# Patient Record
Sex: Female | Born: 1985 | Race: Black or African American | Hispanic: No | Marital: Single | Smoking: Former smoker
Health system: Southern US, Community
[De-identification: ages and names within clinical notes are randomized; demographics above are authoritative.]

## PROBLEM LIST (undated history)

## (undated) DIAGNOSIS — R069 Unspecified abnormalities of breathing: Secondary | ICD-10-CM

## (undated) HISTORY — PX: KNEE SURGERY: SHX244

## (undated) HISTORY — DX: Unspecified abnormalities of breathing: R06.9

---
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
## 2005-02-17 ENCOUNTER — Emergency Department: Payer: Self-pay | Admitting: Internal Medicine

## 2005-05-07 ENCOUNTER — Emergency Department: Payer: Self-pay | Admitting: Emergency Medicine

## 2005-09-12 ENCOUNTER — Inpatient Hospital Stay: Payer: Self-pay | Admitting: Obstetrics and Gynecology

## 2006-01-03 ENCOUNTER — Emergency Department: Payer: Self-pay | Admitting: Emergency Medicine

## 2006-01-20 ENCOUNTER — Emergency Department: Payer: Self-pay | Admitting: Emergency Medicine

## 2006-01-22 ENCOUNTER — Emergency Department: Payer: Self-pay | Admitting: Emergency Medicine

## 2006-04-18 ENCOUNTER — Emergency Department: Payer: Self-pay | Admitting: Emergency Medicine

## 2006-10-05 ENCOUNTER — Emergency Department: Payer: Self-pay | Admitting: Emergency Medicine

## 2006-11-05 ENCOUNTER — Emergency Department: Payer: Self-pay | Admitting: Emergency Medicine

## 2006-11-27 ENCOUNTER — Emergency Department: Payer: Self-pay | Admitting: Emergency Medicine

## 2006-12-22 ENCOUNTER — Emergency Department: Payer: Self-pay | Admitting: Emergency Medicine

## 2006-12-22 ENCOUNTER — Other Ambulatory Visit: Payer: Self-pay

## 2006-12-23 ENCOUNTER — Other Ambulatory Visit: Payer: Self-pay

## 2007-01-01 ENCOUNTER — Emergency Department: Payer: Self-pay | Admitting: Emergency Medicine

## 2007-01-22 ENCOUNTER — Emergency Department: Payer: Self-pay | Admitting: Emergency Medicine

## 2007-03-12 ENCOUNTER — Observation Stay: Payer: Self-pay | Admitting: Obstetrics and Gynecology

## 2007-06-28 ENCOUNTER — Observation Stay: Payer: Self-pay

## 2007-07-03 ENCOUNTER — Inpatient Hospital Stay: Payer: Self-pay

## 2007-07-29 ENCOUNTER — Emergency Department: Payer: Self-pay | Admitting: Emergency Medicine

## 2007-08-30 ENCOUNTER — Emergency Department: Payer: Self-pay | Admitting: Emergency Medicine

## 2007-10-08 ENCOUNTER — Emergency Department: Payer: Self-pay | Admitting: Emergency Medicine

## 2007-10-14 ENCOUNTER — Emergency Department: Payer: Self-pay | Admitting: Emergency Medicine

## 2007-11-07 ENCOUNTER — Emergency Department: Payer: Self-pay

## 2008-10-18 ENCOUNTER — Emergency Department: Payer: Self-pay | Admitting: Emergency Medicine

## 2008-12-14 ENCOUNTER — Emergency Department: Payer: Self-pay | Admitting: Emergency Medicine

## 2017-01-27 ENCOUNTER — Encounter: Payer: Self-pay | Admitting: Emergency Medicine

## 2017-01-27 ENCOUNTER — Emergency Department
Admission: EM | Admit: 2017-01-27 | Discharge: 2017-01-27 | Disposition: A | Discharge status: Home / Self Care | Payer: Medicaid Other | Attending: Emergency Medicine | Admitting: Emergency Medicine

## 2017-01-27 ENCOUNTER — Other Ambulatory Visit: Payer: Self-pay

## 2017-01-27 ENCOUNTER — Emergency Department: Payer: Medicaid Other

## 2017-01-27 DIAGNOSIS — Z3A00 Weeks of gestation of pregnancy not specified: Secondary | ICD-10-CM | POA: Diagnosis not present

## 2017-01-27 DIAGNOSIS — R102 Pelvic and perineal pain: Secondary | ICD-10-CM

## 2017-01-27 DIAGNOSIS — O26899 Other specified pregnancy related conditions, unspecified trimester: Secondary | ICD-10-CM

## 2017-01-27 DIAGNOSIS — R1030 Lower abdominal pain, unspecified: Secondary | ICD-10-CM | POA: Insufficient documentation

## 2017-01-27 DIAGNOSIS — R109 Unspecified abdominal pain: Secondary | ICD-10-CM

## 2017-01-27 DIAGNOSIS — O210 Mild hyperemesis gravidarum: Secondary | ICD-10-CM | POA: Diagnosis present

## 2017-01-27 DIAGNOSIS — O9989 Other specified diseases and conditions complicating pregnancy, childbirth and the puerperium: Secondary | ICD-10-CM | POA: Diagnosis not present

## 2017-01-27 DIAGNOSIS — R112 Nausea with vomiting, unspecified: Secondary | ICD-10-CM

## 2017-01-27 LAB — COMPREHENSIVE METABOLIC PANEL
ALT: 12 U/L — ABNORMAL LOW (ref 14–54)
AST: 14 U/L — ABNORMAL LOW (ref 15–41)
Albumin: 3.9 g/dL (ref 3.5–5.0)
Alkaline Phosphatase: 54 U/L (ref 38–126)
Anion gap: 5 (ref 5–15)
BUN: 12 mg/dL (ref 6–20)
CO2: 26 mmol/L (ref 22–32)
Calcium: 9 mg/dL (ref 8.9–10.3)
Chloride: 106 mmol/L (ref 101–111)
Creatinine, Ser: 0.75 mg/dL (ref 0.44–1.00)
GFR calc Af Amer: >60 mL/min (ref >60)
GFR calc non Af Amer: >60 mL/min (ref >60)
Glucose, Bld: 77 mg/dL (ref 65–99)
Potassium: 3.5 mmol/L (ref 3.5–5.1)
Sodium: 137 mmol/L (ref 135–145)
Total Bilirubin: 0.6 mg/dL (ref 0.3–1.2)
Total Protein: 7.6 g/dL (ref 6.5–8.1)

## 2017-01-27 LAB — CBC
HCT: 40.1 % (ref 35.0–47.0)
Hemoglobin: 13.3 g/dL (ref 12.0–16.0)
MCH: 32 pg (ref 26.0–34.0)
MCHC: 33.2 g/dL (ref 32.0–36.0)
MCV: 96.5 fL (ref 80.0–100.0)
Platelets: 297 10*3/uL (ref 150–440)
RBC: 4.16 MIL/uL (ref 3.80–5.20)
RDW: 12.5 % (ref 11.5–14.5)
WBC: 8.1 10*3/uL (ref 3.6–11.0)

## 2017-01-27 LAB — URINALYSIS, COMPLETE (UACMP) WITH MICROSCOPIC
Bilirubin Urine: NEGATIVE
Glucose, UA: NEGATIVE mg/dL
Hgb urine dipstick: NEGATIVE
Ketones, ur: NEGATIVE mg/dL
Leukocytes, UA: NEGATIVE
Nitrite: NEGATIVE
Protein, ur: NEGATIVE mg/dL
Specific Gravity, Urine: 1.023 (ref 1.005–1.030)
pH: 6 (ref 5.0–8.0)

## 2017-01-27 LAB — CHLAMYDIA/NGC RT PCR (ARMC ONLY)
Chlamydia Tr: NOT DETECTED
N gonorrhoeae: NOT DETECTED

## 2017-01-27 LAB — LIPASE, BLOOD: Lipase: 24 U/L (ref 11–51)

## 2017-01-27 LAB — POCT PREGNANCY, URINE: Preg Test, Ur: NEGATIVE

## 2017-01-27 LAB — HCG, QUANTITATIVE, PREGNANCY: hCG, Beta Chain, Quant, S: 160 m[IU]/mL — ABNORMAL HIGH (ref <5)

## 2017-01-27 MED ORDER — ONDANSETRON 4 MG PO TBDP
4.0000 mg | ORAL_TABLET | Freq: Once | ORAL | Status: AC
  Administered 2017-01-27: 4 mg via ORAL

## 2017-01-27 MED ORDER — ONDANSETRON 4 MG PO TBDP: 4.0000 mg | ORAL_TABLET | Freq: Three times a day (TID) | ORAL | 0 refills | Status: DC | PRN

## 2017-01-27 MED ORDER — ONDANSETRON 4 MG PO TBDP: 4.0000 mg | ORAL_TABLET | Freq: Once | ORAL | Status: DC

## 2017-01-27 MED ORDER — ONDANSETRON 4 MG PO TBDP
ORAL_TABLET | ORAL | Status: AC
  Filled 2017-01-27: qty 1

## 2017-01-27 NOTE — ED Provider Notes (Addendum)
[facility] Emergency Department Provider Note       Time seen: ----------------------------------------- 2:47 PM on 01/27/2017 -----------------------------------------   I have reviewed the triage vital signs and the nursing notes.  HISTORY   Chief Complaint Emesis    HPI Heidi Douglas is a 31 y.o. female with a history of knee surgery but otherwise healthy who presents to the ED for vomiting and intermittent lower abdominal cramping.  She denies any fevers or diarrhea.  She states her last menstrual period was over a month ago and her menstrual cycle is late at this time.  She denies any recent illness or other complaints.  History reviewed. No pertinent past medical history.  There are no active problems to display for this patient.   Past Surgical History:  Procedure Laterality Date  . KNEE SURGERY      Allergies Patient has no known allergies.  Social History Social History   Tobacco Use  . Smoking status: Never Smoker  . Smokeless tobacco: Never Used  Substance Use Topics  . Alcohol use: No    Frequency: Never  . Drug use: No    Review of Systems Constitutional: Negative for fever. Cardiovascular: Negative for chest pain. Respiratory: Negative for shortness of breath. Gastrointestinal: Positive for abdominal cramping and vomiting Genitourinary: Negative for dysuria. Musculoskeletal: Negative for back pain. Skin: Negative for rash. Neurological: Negative for headaches, focal weakness or numbness.  All systems negative/normal/unremarkable except as stated in the HPI  ____________________________________________   PHYSICAL EXAM:  VITAL SIGNS: ED Triage Vitals  Enc Vitals Group     BP 01/27/17 1152 138/78     Pulse Rate 01/27/17 1152 86     Resp 01/27/17 1152 18     Temp 01/27/17 1152 98.6 F (37 C)     Temp Source 01/27/17 1152 Oral     SpO2 01/27/17 1152 100 %     Weight 01/27/17 1153 175 lb (79.4 kg)   Height 01/27/17 1153 5\' 3"  (1.6 m)     Head Circumference --      Peak Flow --      Pain Score 01/27/17 1424 7     Pain Loc --      Pain Edu? --      Excl. in GC? --     Constitutional: Alert and oriented. Well appearing and in no distress. Eyes: Conjunctivae are normal. Normal extraocular movements. ENT   Head: Normocephalic and atraumatic.   Nose: No congestion/rhinnorhea.   Mouth/Throat: Mucous membranes are moist.   Neck: No stridor. Cardiovascular: Normal rate, regular rhythm. No murmurs, rubs, or gallops. Respiratory: Normal respiratory effort without tachypnea nor retractions. Breath sounds are clear and equal bilaterally. No wheezes/rales/rhonchi. Gastrointestinal: Soft and nontender. Normal bowel sounds Musculoskeletal: Nontender with normal range of motion in extremities. No lower extremity tenderness nor edema. Neurologic:  Normal speech and language. No gross focal neurologic deficits are appreciated.  Skin:  Skin is warm, dry and intact. No rash noted. Psychiatric: Mood and affect are normal. Speech and behavior are normal.  ____________________________________________  ED COURSE:  As part of my medical decision making, I reviewed the following data within the electronic MEDICAL RECORD NUMBER History obtained from family if available, nursing notes, old chart and ekg, as well as notes from prior ED visits. Patient presented for abdominal pain and vomiting, we will assess with labs and imaging as indicated at this time. Clinical Course as of Jan 28 1704  Fri Jan 27, 2017  1537  HCG, Beta Chain, Quant, S: (!) 160 [JW]  1541 G1P0  [JW]    Clinical Course User Index [JW] Emily FilbertWilliams, Clela Hagadorn E, MD   Procedures ____________________________________________   LABS (pertinent positives/negatives)  Labs Reviewed  COMPREHENSIVE METABOLIC PANEL - Abnormal; Notable for the following components:      Result Value   AST 14 (*)    ALT 12 (*)    All other components  within normal limits  URINALYSIS, COMPLETE (UACMP) WITH MICROSCOPIC - Abnormal; Notable for the following components:   Color, Urine YELLOW (*)    APPearance CLEAR (*)    Bacteria, UA MANY (*)    Squamous Epithelial / LPF 0-5 (*)    All other components within normal limits  HCG, QUANTITATIVE, PREGNANCY - Abnormal; Notable for the following components:   hCG, Beta Chain, Quant, S 160 (*)    All other components within normal limits  CHLAMYDIA/NGC RT PCR ([facility] ONLY)  LIPASE, BLOOD  CBC  POC URINE PREG, ED  POCT PREGNANCY, URINE  Imaging: IMPRESSION: 1. No intrauterine pregnancy is visualized. Hypoechoic area with central low echogenic focus measuring 1.3 cm, either exophytic to the left ovary or immediately adjacent to it, possibility of ectopic pregnancy is raised. No free fluid at this time. Close clinical monitoring with trending of HCG and repeat short interval ultrasound is recommended. 2. Negative for ovarian torsion  Critical Value/emergent results were called by telephone at the time of interpretation on 01/27/2017 at 5:01 pm to Dr. Daryel NovemberJONATHAN Marin Milley , who verbally acknowledged these results. ____________________________________________  DIFFERENTIAL DIAGNOSIS   Pregnancy, ectopic pregnancy, dysmenorrhea, ovarian cyst, PID, gas pain, constipation  FINAL ASSESSMENT AND PLAN  Pregnancy, abdominal pain, vomiting   Plan: Patient had presented for abdominal pain and vomiting.  Initially it was thought that she was not pregnant, however the hCG in her serum was elevated.  Ultrasound was obtained which is indeterminate at this time.  I will advised follow-up in 2 days for repeat quant and reevaluation.  She will be given antiemetics and overall looks well here with no pain at this time.  Emily FilbertWilliams, Leshae Mcclay E, MD   Note: This note was generated in part or whole with voice recognition software. Voice recognition is usually quite accurate but there are  transcription errors that can and very often do occur. I apologize for any typographical errors that were not detected and corrected.     Emily FilbertWilliams, Xavious Sharrar E, MD 01/27/17 1449    Emily FilbertWilliams, Elicia Lui E, MD 01/27/17 [phone]

## 2017-01-27 NOTE — ED Notes (Signed)
Dr Williams at bedside 

## 2017-01-27 NOTE — ED Notes (Signed)
Patient transported to US 

## 2017-01-27 NOTE — ED Triage Notes (Signed)
Pt here for intermittent vomiting over last week with some intermittent lower abdominal cramping.  Denies fevers. Denies diarrhea. Ambulatory. VSS. Last period nov 23 but reports sometimes not regular.

## 2017-01-28 DIAGNOSIS — Z3A01 Less than 8 weeks gestation of pregnancy: Secondary | ICD-10-CM | POA: Diagnosis not present

## 2017-01-28 DIAGNOSIS — O200 Threatened abortion: Secondary | ICD-10-CM | POA: Insufficient documentation

## 2017-01-28 DIAGNOSIS — R1030 Lower abdominal pain, unspecified: Secondary | ICD-10-CM | POA: Insufficient documentation

## 2017-01-28 DIAGNOSIS — O26891 Other specified pregnancy related conditions, first trimester: Secondary | ICD-10-CM | POA: Insufficient documentation

## 2017-01-28 DIAGNOSIS — O209 Hemorrhage in early pregnancy, unspecified: Secondary | ICD-10-CM | POA: Diagnosis present

## 2017-01-29 ENCOUNTER — Emergency Department
Admission: EM | Admit: 2017-01-29 | Discharge: 2017-01-29 | Disposition: A | Discharge status: Home / Self Care | Payer: Medicaid Other | Attending: Emergency Medicine | Admitting: Emergency Medicine

## 2017-01-29 ENCOUNTER — Other Ambulatory Visit: Payer: Self-pay

## 2017-01-29 ENCOUNTER — Encounter: Payer: Self-pay | Admitting: Emergency Medicine

## 2017-01-29 DIAGNOSIS — O200 Threatened abortion: Secondary | ICD-10-CM

## 2017-01-29 LAB — BASIC METABOLIC PANEL
Anion gap: 8 (ref 5–15)
BUN: 14 mg/dL (ref 6–20)
CO2: 24 mmol/L (ref 22–32)
Calcium: 9.3 mg/dL (ref 8.9–10.3)
Chloride: 105 mmol/L (ref 101–111)
Creatinine, Ser: 0.69 mg/dL (ref 0.44–1.00)
GFR calc Af Amer: >60 mL/min (ref >60)
GFR calc non Af Amer: >60 mL/min (ref >60)
Glucose, Bld: 97 mg/dL (ref 65–99)
Potassium: 3.7 mmol/L (ref 3.5–5.1)
Sodium: 137 mmol/L (ref 135–145)

## 2017-01-29 LAB — URINALYSIS, COMPLETE (UACMP) WITH MICROSCOPIC
Bacteria, UA: NONE SEEN
Bilirubin Urine: NEGATIVE
Glucose, UA: NEGATIVE mg/dL
Hgb urine dipstick: NEGATIVE
Ketones, ur: NEGATIVE mg/dL
Leukocytes, UA: NEGATIVE
Nitrite: NEGATIVE
Protein, ur: NEGATIVE mg/dL
RBC / HPF: NONE SEEN RBC/hpf (ref 0–5)
Specific Gravity, Urine: 1.03 (ref 1.005–1.030)
pH: 5 (ref 5.0–8.0)

## 2017-01-29 LAB — HCG, QUANTITATIVE, PREGNANCY: hCG, Beta Chain, Quant, S: 128 m[IU]/mL — ABNORMAL HIGH (ref <5)

## 2017-01-29 LAB — POCT PREGNANCY, URINE: Preg Test, Ur: NEGATIVE

## 2017-01-29 NOTE — ED Provider Notes (Signed)
[facility] Emergency Department Provider Note  ____________________________________________   First MD Initiated Contact with Patient 01/29/17 0236     (approximate)  I have reviewed the triage vital signs and the nursing notes.   HISTORY  Chief Complaint Vaginal Bleeding   HPI Heidi Douglas is a 31 y.o. female who comes the emergency department with lower pelvic cramping and slight vaginal bleeding.  She was seen in our emergency department yesterday and had a pelvic ultrasound which is negative for obvious intrauterine pregnancy although was concerning for a possible mass in her tube.  She was told to follow[facility]up with [facility]ne[facility] in 2 days.  She comes the emergency department today because of the bleeding.  She is G3 P2 with 2 healthy children.  She denies fevers or chills.  She denies nausea or vomiting.  Her symptoms have been insidious onset slowly progressive intermittent.  Nothing seems to make them better or worse.  History reviewed. No pertinent past medical history.  There are no active problems to display for this patient.   Past Surgical History:  Procedure Laterality Date  . KNEE SURGERY      Prior to Admission medications   Medication Sig Start Date End Date Taking? Authorizing Provider  ondansetron (ZOFRAN ODT) 4 MG disintegrating tablet Take 1 tablet (4 mg total) by mouth every 8 (eight) hours as needed for nausea or vomiting. 01/27/17   Emily FilbertWilliams, Jonathan E, MD    Allergies Patient has no known allergies.  No family history on file.  Social History Social History   Tobacco Use  . Smoking status: Never Smoker  . Smokeless tobacco: Never Used  Substance Use Topics  . Alcohol use: No    Frequency: Never  . Drug use: No    Review of Systems Constitutional: No fever/chills Eyes: No visual changes. ENT: No sore throat. Cardiovascular: Denies chest pain. Respiratory: Denies shortness of breath. Gastrointestinal:  Positive for abdominal pain.  No nausea, no vomiting.  No diarrhea.  No constipation. Genitourinary: Negative for dysuria. Musculoskeletal: Negative for back pain. Skin: Negative for rash. Neurological: Negative for headaches, focal weakness or numbness.   ____________________________________________   PHYSICAL EXAM:  VITAL SIGNS: ED Triage Vitals  Enc Vitals Group     BP 01/29/17 0027 115/68     Pulse Rate 01/29/17 0027 78     Resp 01/29/17 0027 18     Temp 01/29/17 0027 98.1 F (36.7 C)     Temp Source 01/29/17 0027 Oral     SpO2 --      Weight 01/29/17 0024 175 lb (79.4 kg)     Height 01/29/17 0024 5\' 3"  (1.6 m)     Head Circumference --      Peak Flow --      Pain Score 01/29/17 0023 8     Pain Loc --      Pain Edu? --      Excl. in GC? --     Constitutional: Alert and oriented x4 well-appearing nontoxic no diaphoresis speaks in full clear sentences Eyes: PERRL EOMI. Head: Atraumatic. Nose: No congestion/rhinnorhea. Mouth/Throat: No trismus Neck: No stridor.   Cardiovascular: Normal rate, regular rhythm. Grossly normal heart sounds.  Good peripheral circulation. Respiratory: Normal respiratory effort.  No retractions. Lungs CTAB and moving good air Gastrointestinal: Soft nontender Musculoskeletal: No lower extremity edema   Neurologic:  Normal speech and language. No gross focal neurologic deficits are appreciated. Skin:  Skin is warm, dry and intact. No  rash noted. Psychiatric: Mood and affect are normal. Speech and behavior are normal.    ____________________________________________   DIFFERENTIAL includes but not limited to  Ectopic pregnancy, threatened abortion, inevitable abortion, septic abortion ____________________________________________   LABS (all labs ordered are listed, but only abnormal results are displayed)  Labs Reviewed  HCG, QUANTITATIVE, PREGNANCY - Abnormal; Notable for the following components:      Result Value   hCG, Beta  Chain, Quant, S 128 (*)    All other components within normal limits  URINALYSIS, COMPLETE (UACMP) WITH MICROSCOPIC - Abnormal; Notable for the following components:   Color, Urine YELLOW (*)    APPearance CLEAR (*)    Squamous Epithelial / LPF 0-5 (*)    All other components within normal limits  BASIC METABOLIC PANEL  CBC WITH DIFFERENTIAL/PLATELET  CBC WITH DIFFERENTIAL/PLATELET  POCT PREGNANCY, URINE  POC URINE PREG, ED    Lab work reviewed by me shows decreasing beta-hCG __________________________________________  EKG   ____________________________________________  RADIOLOGY  Pelvic ultrasound from yesterday shows no intrauterine pregnancy but does show somewhat of a tubal anomaly ____________________________________________   PROCEDURES  Procedure(s) performed: no  Procedures  Critical Care performed: no  Observation: no ____________________________________________   INITIAL IMPRESSION / ASSESSMENT AND PLAN / ED COURSE  Pertinent labs & imaging results that were available during my care of the patient were reviewed by me and considered in my medical decision making (see chart for details).  The patient is hemodynamically stable and very well-appearing with benign abdominal exam.  Her 24-hour beta hCG is actually down from yesterday indicating likely inevitable miscarriage.  She does not yet have [facility] as well refer her for OB periods pelvic rest discussed.  The patient was discharged home in improved condition verbalizes understanding and agreement with the plan.  The patient understands that this could represent an ectopic pregnancy and to return immediately for severe pain or for any other issues.      ____________________________________________   FINAL CLINICAL IMPRESSION(S) / ED DIAGNOSES  Final diagnoses:  Threatened abortion      NEW MEDICATIONS STARTED DURING THIS VISIT:  This SmartLink is deprecated. Use AVSMEDLIST  instead to display the medication list for a patient.   Note:  This document was prepared using Dragon voice recognition software and may include unintentional dictation errors.     Merrily Brittleifenbark, Alazae Crymes, MD 01/29/17 [id number]

## 2017-01-29 NOTE — ED Notes (Signed)
Dr. Rifenbark at bedside 

## 2017-01-29 NOTE — ED Triage Notes (Signed)
Pt arrive ambulatory to triage with c/o vaginal bleeding when she wipes. Pt reports that she is around five weeks pregnant which she found out at this facility. Pt is in NAD.

## 2017-01-29 NOTE — Discharge Instructions (Signed)
Please make an appointment to [facility] this coming Monday for reevaluation and return to the emergency department sooner for any concerns whatsoever.  It was a pleasure to take care of you today, and thank you for coming to our emergency department.  If you have any questions or concerns before leaving please ask the nurse to grab me and I'm more than happy to go through your aftercare instructions again.  If you were prescribed any opioid pain medication today such as Norco, Vicodin, Percocet, morphine, hydrocodone, or oxycodone please make sure you do not drive when you are taking this medication as it can alter your ability to drive safely.  If you have any concerns once you are home that you are not improving or are in fact getting worse before you can make it to your follow-up appointment, please do not hesitate to call 911 and come back for further evaluation.  Merrily BrittleNeil Garin Mata, MD  Results for orders placed or performed during the hospital encounter of 01/29/17  hCG, quantitative, pregnancy  Result Value Ref Range   hCG, Beta Chain, Quant, S 128 (H) <5 mIU/mL  Basic metabolic panel  Result Value Ref Range   Sodium 137 135 - 145 mmol/L   Potassium 3.7 3.5 - 5.1 mmol/L   Chloride 105 101 - 111 mmol/L   CO2 24 22 - 32 mmol/L   Glucose, Bld 97 65 - 99 mg/dL   BUN 14 6 - 20 mg/dL   Creatinine, Ser 1.610.69 0.44 - 1.00 mg/dL   Calcium 9.3 8.9 - 09.610.3 mg/dL   GFR calc non Af Amer >60 >60 mL/min   GFR calc Af Amer >60 >60 mL/min   Anion gap 8 5 - 15  Urinalysis, Complete w Microscopic  Result Value Ref Range   Color, Urine YELLOW (A) YELLOW   APPearance CLEAR (A) CLEAR   Specific Gravity, Urine 1.030 1.005 - 1.030   pH 5.0 5.0 - 8.0   Glucose, UA NEGATIVE NEGATIVE mg/dL   Hgb urine dipstick NEGATIVE NEGATIVE   Bilirubin Urine NEGATIVE NEGATIVE   Ketones, ur NEGATIVE NEGATIVE mg/dL   Protein, ur NEGATIVE NEGATIVE mg/dL   Nitrite NEGATIVE NEGATIVE   Leukocytes, UA  NEGATIVE NEGATIVE   RBC / HPF NONE SEEN 0 - 5 RBC/hpf   WBC, UA 0-5 0 - 5 WBC/hpf   Bacteria, UA NONE SEEN NONE SEEN   Squamous Epithelial / LPF 0-5 (A) NONE SEEN   Mucus PRESENT   Pregnancy, urine POC  Result Value Ref Range   Preg Test, Ur NEGATIVE NEGATIVE   [place] Ob Comp Less 14 Wks  Result Date: 01/27/2017 CLINICAL DATA:  Abdominal pain for 3 weeks, HCG is 160 EXAM: OBSTETRIC <14 WK US AND TRANSVAGINAL OB US DOPPLER ULTRASOUND OF OVARIES TECHNIQUE: Both transabdominal and transvaginal ultrasound examinations were performed for complete evaluation of the gestation as well as the maternal uterus, adnexal regions, and pelvic cul-de-sac. Transvaginal technique was performed to assess early pregnancy. Color and duplex Doppler ultrasound was utilized to evaluate blood flow to the ovaries. COMPARISON:  None. FINDINGS: Intrauterine gestational sac: Not seen Yolk sac:  Not seen Embryo:  Not seen Cardiac Activity: Not seen Maternal uterus/adnexae: The right ovary measures 2.8 x 1.5 x 2.9 cm. The left ovary measures 3 x 2.1 x 2.8 cm. Corpus luteal body in the left ovary measuring 1.3 cm. Exophytic to or immediately adjacent to the left ovary is a mass with peripheral hypo echo tissue and central low echogenic  focus measuring 1.3 cm. No significant free fluid. Pulsed Doppler evaluation of both ovaries demonstrates normal appearing low-resistance arterial and venous waveforms. IMPRESSION: 1. No intrauterine pregnancy is visualized. Hypoechoic area with central low echogenic focus measuring 1.3 cm, either exophytic to the left ovary or immediately adjacent to it, possibility of ectopic pregnancy is raised. No free fluid at this time. Close clinical monitoring with trending of HCG and repeat short interval ultrasound is recommended. 2. Negative for ovarian torsion Critical Value/emergent results were called by telephone at the time of interpretation on 01/27/2017 at 5:01 pm to Dr. Daryel November , who verbally  acknowledged these results. Electronically Signed   By: Jasmine Pang M.D.   On: 01/27/2017 17:01   US Ob Transvaginal  Result Date: 01/27/2017 CLINICAL DATA:  Abdominal pain for 3 weeks, HCG is 160 EXAM: OBSTETRIC <14 WK [place] AND TRANSVAGINAL OB US DOPPLER ULTRASOUND OF OVARIES TECHNIQUE: Both transabdominal and transvaginal ultrasound examinations were performed for complete evaluation of the gestation as well as the maternal uterus, adnexal regions, and pelvic cul-de-sac. Transvaginal technique was performed to assess early pregnancy. Color and duplex Doppler ultrasound was utilized to evaluate blood flow to the ovaries. COMPARISON:  None. FINDINGS: Intrauterine gestational sac: Not seen Yolk sac:  Not seen Embryo:  Not seen Cardiac Activity: Not seen Maternal uterus/adnexae: The right ovary measures 2.8 x 1.5 x 2.9 cm. The left ovary measures 3 x 2.1 x 2.8 cm. Corpus luteal body in the left ovary measuring 1.3 cm. Exophytic to or immediately adjacent to the left ovary is a mass with peripheral hypo echo tissue and central low echogenic focus measuring 1.3 cm. No significant free fluid. Pulsed Doppler evaluation of both ovaries demonstrates normal appearing low-resistance arterial and venous waveforms. IMPRESSION: 1. No intrauterine pregnancy is visualized. Hypoechoic area with central low echogenic focus measuring 1.3 cm, either exophytic to the left ovary or immediately adjacent to it, possibility of ectopic pregnancy is raised. No free fluid at this time. Close clinical monitoring with trending of HCG and repeat short interval ultrasound is recommended. 2. Negative for ovarian torsion Critical Value/emergent results were called by telephone at the time of interpretation on 01/27/2017 at 5:01 pm to Dr. Daryel November , who verbally acknowledged these results. Electronically Signed   By: Jasmine Pang M.D.   On: 01/27/2017 17:01   US Pelvic Doppler (torsion R/o Or Mass Arterial Flow)  Result Date:  01/27/2017 CLINICAL DATA:  Abdominal pain for 3 weeks, HCG is 160 EXAM: OBSTETRIC <14 WK [place] AND TRANSVAGINAL OB US DOPPLER ULTRASOUND OF OVARIES TECHNIQUE: Both transabdominal and transvaginal ultrasound examinations were performed for complete evaluation of the gestation as well as the maternal uterus, adnexal regions, and pelvic cul-de-sac. Transvaginal technique was performed to assess early pregnancy. Color and duplex Doppler ultrasound was utilized to evaluate blood flow to the ovaries. COMPARISON:  None. FINDINGS: Intrauterine gestational sac: Not seen Yolk sac:  Not seen Embryo:  Not seen Cardiac Activity: Not seen Maternal uterus/adnexae: The right ovary measures 2.8 x 1.5 x 2.9 cm. The left ovary measures 3 x 2.1 x 2.8 cm. Corpus luteal body in the left ovary measuring 1.3 cm. Exophytic to or immediately adjacent to the left ovary is a mass with peripheral hypo echo tissue and central low echogenic focus measuring 1.3 cm. No significant free fluid. Pulsed Doppler evaluation of both ovaries demonstrates normal appearing low-resistance arterial and venous waveforms. IMPRESSION: 1. No intrauterine pregnancy is visualized. Hypoechoic area with central low echogenic focus  measuring 1.3 cm, either exophytic to the left ovary or immediately adjacent to it, possibility of ectopic pregnancy is raised. No free fluid at this time. Close clinical monitoring with trending of HCG and repeat short interval ultrasound is recommended. 2. Negative for ovarian torsion Critical Value/emergent results were called by telephone at the time of interpretation on 01/27/2017 at 5:01 pm to Dr. Daryel NovemberJONATHAN WILLIAMS , who verbally acknowledged these results. Electronically Signed   By: Jasmine PangKim  Fujinaga M.D.   On: 01/27/2017 17:01

## 2017-01-29 NOTE — ED Notes (Signed)
Pt says she was here 2 days ago with N/V and found out she is [redacted] weeks pregnant; returns tonight with some abd cramping and vaginal spotting; abd soft; BS x 4

## 2017-01-30 ENCOUNTER — Emergency Department
Admission: EM | Admit: 2017-01-30 | Discharge: 2017-01-30 | Disposition: A | Discharge status: Home / Self Care | Payer: Medicaid Other | Attending: Emergency Medicine | Admitting: Emergency Medicine

## 2017-01-30 ENCOUNTER — Encounter: Payer: Self-pay | Admitting: Emergency Medicine

## 2017-01-30 ENCOUNTER — Other Ambulatory Visit: Payer: Self-pay

## 2017-01-30 ENCOUNTER — Encounter (HOSPITAL_COMMUNITY): Payer: Self-pay | Admitting: Emergency Medicine

## 2017-01-30 ENCOUNTER — Emergency Department (HOSPITAL_COMMUNITY)
Admission: EM | Admit: 2017-01-30 | Discharge: 2017-01-31 | Disposition: A | Discharge status: Home / Self Care | Payer: Medicaid Other | Attending: Emergency Medicine | Admitting: Emergency Medicine

## 2017-01-30 DIAGNOSIS — N938 Other specified abnormal uterine and vaginal bleeding: Secondary | ICD-10-CM | POA: Diagnosis present

## 2017-01-30 DIAGNOSIS — Z5321 Procedure and treatment not carried out due to patient leaving prior to being seen by health care provider: Secondary | ICD-10-CM | POA: Insufficient documentation

## 2017-01-30 DIAGNOSIS — Z3A01 Less than 8 weeks gestation of pregnancy: Secondary | ICD-10-CM | POA: Diagnosis not present

## 2017-01-30 DIAGNOSIS — R102 Pelvic and perineal pain: Secondary | ICD-10-CM | POA: Diagnosis not present

## 2017-01-30 DIAGNOSIS — O209 Hemorrhage in early pregnancy, unspecified: Secondary | ICD-10-CM | POA: Insufficient documentation

## 2017-01-30 LAB — HCG, QUANTITATIVE, PREGNANCY: hCG, Beta Chain, Quant, S: 131 m[IU]/mL — ABNORMAL HIGH (ref <5)

## 2017-01-30 LAB — ABO/RH: ABO/RH(D): A POS

## 2017-01-30 NOTE — ED Triage Notes (Signed)
Pt [facility] at [facility] today, has been seen multiple times for the same, states she is [redacted] weeks pregnant and has been having vaginal bleeding and cramping since Saturday, denies any clots. [facility]. Pt states she [facility] like [facility] so she came here to be seen.

## 2017-01-30 NOTE — ED Notes (Signed)
Pt did not answer for room. 

## 2017-01-30 NOTE — ED Triage Notes (Addendum)
Pt here for vaginal bleeding and [redacted] weeks pregnant. Seen X 2 here for same. Bleeding and cramping worse per pt and OBGYN closed today.  G4P3

## 2017-01-30 NOTE — ED Notes (Signed)
Patient named called in Lobby at this time by this RN with no answer X3

## 2017-01-31 NOTE — ED Notes (Signed)
Pt called multiple times for treatment room and vitals recheck by this RN. Unable to locate in lobby, presumed to have left without being seen. 

## 2017-02-02 ENCOUNTER — Ambulatory Visit (INDEPENDENT_AMBULATORY_CARE_PROVIDER_SITE_OTHER): Payer: Medicaid Other | Admitting: Certified Nurse Midwife

## 2017-02-02 ENCOUNTER — Encounter: Payer: Self-pay | Admitting: Certified Nurse Midwife

## 2017-02-02 VITALS — BP 131/70 | HR 74 | Wt 180.3 lb

## 2017-02-02 DIAGNOSIS — Z09 Encounter for follow-up examination after completed treatment for conditions other than malignant neoplasm: Secondary | ICD-10-CM | POA: Diagnosis not present

## 2017-02-02 DIAGNOSIS — O200 Threatened abortion: Secondary | ICD-10-CM

## 2017-02-02 NOTE — Patient Instructions (Signed)
Vaginal Bleeding During Pregnancy, First Trimester °A small amount of bleeding (spotting) from the vagina is common in early pregnancy. Sometimes the bleeding is normal and is not a problem, and sometimes it is a sign of something serious. Be sure to tell your doctor about any bleeding from your vagina right away. °Follow these instructions at home: °· Watch your condition for any changes. °· Follow your doctor's instructions about how active you can be. °· If you are on bed rest: °? You may need to stay in bed and only get up to use the bathroom. °? You may be allowed to do some activities. °? If you need help, make plans for someone to help you. °· Write down: °? The number of pads you use each day. °? How often you change pads. °? How soaked (saturated) your pads are. °· Do not use tampons. °· Do not douche. °· Do not have sex or orgasms until your doctor says it is okay. °· If you pass any tissue from your vagina, save the tissue so you can show it to your doctor. °· Only take medicines as told by your doctor. °· Do not take aspirin because it can make you bleed. °· Keep all follow-up visits as told by your doctor. °Contact a doctor if: °· You bleed from your vagina. °· You have cramps. °· You have labor pains. °· You have a fever that does not go away after you take medicine. °Get help right away if: °· You have very bad cramps in your back or belly (abdomen). °· You pass large clots or tissue from your vagina. °· You bleed more. °· You feel light-headed or weak. °· You pass out (faint). °· You have chills. °· You are leaking fluid or have a gush of fluid from your vagina. °· You pass out while pooping (having a bowel movement). °This information is not intended to replace advice given to you by your health care provider. Make sure you discuss any questions you have with your health care provider. °Document Released: 06/03/2013 Document Revised: 06/25/2015 Document Reviewed: 09/24/2012 °Elsevier Interactive  Patient Education © 2018 [employer] ° °

## 2017-02-02 NOTE — Progress Notes (Signed)
ED follow up- Patient was seen in the ED due to abdominal cramping and vaginal bleeding. She reports that the bleeding has stopped, but she still has cramping. She took a home pregnancy test 2 days ago and it was still positive.

## 2017-02-03 ENCOUNTER — Other Ambulatory Visit: Payer: Self-pay | Admitting: Certified Nurse Midwife

## 2017-02-03 ENCOUNTER — Encounter: Payer: Self-pay | Admitting: Certified Nurse Midwife

## 2017-02-03 DIAGNOSIS — N926 Irregular menstruation, unspecified: Secondary | ICD-10-CM

## 2017-02-03 LAB — BETA HCG QUANT (REF LAB): hCG Quant: 99 m[IU]/mL

## 2017-02-03 NOTE — Progress Notes (Signed)
GYN ENCOUNTER NOTE  Subjective:       Heidi Douglas is a 32 y.o. G3P2 female here for ER follow up secondary to threaten abortion.   Originally seen in [facility] ER for vaginal bleeding between 12/28-12/03/2016. Reports positive home pregnancy test yesterday and cessation of vaginal bleeding on Tuesday, 01/31/2017. She endorses intermittent abdominal cramping.  Denies difficulty breathing or respiratory distress, chest pain, abdominal pain, vaginal bleeding dysuria, and leg pain or swelling.    Gynecologic History  Patient's last menstrual period was 12/23/2016.  Estimated date of birth: 09/29/2017.  Gestational age: 94 weeks 6 days  Obstetric History  OB History  Gravida Para Term Preterm AB Living  3 2          SAB TAB Ectopic Multiple Live Births               # Outcome Date GA Lbr Len/2nd Weight Sex Delivery Anes PTL Lv  3 Current           2 Para           1 Para               No past medical history on file.  Past Surgical History:  Procedure Laterality Date  . KNEE SURGERY      Current Outpatient Medications on File Prior to Visit  Medication Sig Dispense Refill  . ondansetron (ZOFRAN ODT) 4 MG disintegrating tablet Take 1 tablet (4 mg total) by mouth every 8 (eight) hours as needed for nausea or vomiting. (Patient not taking: Reported on 02/02/2017) 20 tablet 0   No current facility-administered medications on file prior to visit.     No Known Allergies  Social History   Socioeconomic History  . Marital status: In a relationship    Spouse name: Not on file  . Number of children: Not on file  . Years of education: Not on file  . Highest education level: Not on file  Social Needs  . Financial resource strain: Not on file  . Food insecurity - worry: Not on file  . Food insecurity - inability: Not on file  . Transportation needs - medical: Not on file  . Transportation needs - non-medical: Not on file  Occupational History  . Not on file  Tobacco Use   . Smoking status: Never Smoker  . Smokeless tobacco: Never Used  Substance and Sexual Activity  . Alcohol use: No    Frequency: Never  . Drug use: No  . Sexual activity: Yes  Other Topics Concern  . Not on file  Social History Narrative  . Not on file   The following portions of the patient's history were reviewed and updated as appropriate: allergies, current medications, past family history, past medical history, past social history, past surgical history and problem list.  Review of Systems  Review of Systems - Negative except as noted above. History obtained from the patient.  Objective:   BP 131/70   Pulse 74   Wt 180 lb 4.8 oz (81.8 kg)   LMP 12/23/2016   BMI 31.94 kg/m    CONSTITUTIONAL: Well-developed, well-nourished female in no acute distress.   HENT:  Normocephalic, atraumatic.   NECK: Normal range of motion, supple, no masses.    SKIN: Skin is warm and dry. No rash noted. Not diaphoretic. No erythema. No pallor.  NEUROLGIC: Alert and oriented to person, place, and time.   PSYCHIATRIC: Normal mood and affect. Normal behavior. Normal judgment and thought  content.  MUSCULOSKELETAL: Normal range of motion. No tenderness.  No cyanosis, clubbing, or edema.   Ref Range & Units 4d ago  hCG, Beta Chain, Quant, S <5 mIU/mL 131 Abnormally high    Comment:      GEST. AGE   CONC. (mIU/mL)   <=1 WEEK    5 - 50    2 WEEKS    50 - 500    3 WEEKS    100 - 10,000    4 WEEKS   1,000 - 30,000    5 WEEKS   3,500 - 115,000   6-8 WEEKS   12,000 - 270,000   12 WEEKS   15,000 - 220,000      FEMALE AND NON-PREGNANT FEMALE:    LESS THAN 5 mIU/mL  Performed at [facility], [address] [place], [place] [id number]          CLINICAL DATA:  Abdominal pain for 3 weeks, HCG is 160  EXAM: OBSTETRIC <14 WK US AND TRANSVAGINAL OB  US DOPPLER ULTRASOUND OF OVARIES [facility] 01/27/2017)  TECHNIQUE: Both transabdominal  and transvaginal ultrasound examinations were performed for complete evaluation of the gestation as well as the maternal uterus, adnexal regions, and pelvic cul-de-sac. Transvaginal technique was performed to assess early pregnancy.  Color and duplex Doppler ultrasound was utilized to evaluate blood flow to the ovaries.  COMPARISON:  None.  FINDINGS: Intrauterine gestational sac: Not seen  Yolk sac:  Not seen  Embryo:  Not seen  Cardiac Activity: Not seen  Maternal uterus/adnexae: The right ovary measures 2.8 x 1.5 x 2.9 cm. The left ovary measures 3 x 2.1 x 2.8 cm. Corpus luteal body in the left ovary measuring 1.3 cm. Exophytic to or immediately adjacent to the left ovary is a mass with peripheral hypo echo tissue and central low echogenic focus measuring 1.3 cm. No significant free fluid.  Pulsed Doppler evaluation of both ovaries demonstrates normal appearing low-resistance arterial and venous waveforms.  IMPRESSION: 1. No intrauterine pregnancy is visualized. Hypoechoic area with central low echogenic focus measuring 1.3 cm, either exophytic to the left ovary or immediately adjacent to it, possibility of ectopic pregnancy is raised. No free fluid at this time. Close clinical monitoring with trending of HCG and repeat short interval ultrasound is recommended. 2. Negative for ovarian torsion  Assessment:   1. Threatened abortion  - Beta HCG, Quant  2. Follow-up exam   Plan:   Labs: Beta, see orders.   Reviewed red flag symptoms and when to call.   RTC x 1 week for follow up and dating/viability US or sooner if needed.    Gunnar BullaJenkins Michelle Prentis Langdon, CNM [facility], [facility]

## 2017-02-06 ENCOUNTER — Other Ambulatory Visit: Payer: Self-pay

## 2017-02-06 DIAGNOSIS — O200 Threatened abortion: Secondary | ICD-10-CM

## 2017-02-07 ENCOUNTER — Other Ambulatory Visit: Payer: Medicaid Other

## 2017-02-07 DIAGNOSIS — Z09 Encounter for follow-up examination after completed treatment for conditions other than malignant neoplasm: Secondary | ICD-10-CM

## 2017-02-07 DIAGNOSIS — O200 Threatened abortion: Secondary | ICD-10-CM

## 2017-02-08 LAB — BETA HCG QUANT (REF LAB): hCG Quant: 59 m[IU]/mL

## 2017-02-09 ENCOUNTER — Encounter: Payer: Self-pay | Admitting: Certified Nurse Midwife

## 2017-02-09 ENCOUNTER — Other Ambulatory Visit: Payer: Medicaid Other

## 2017-02-10 ENCOUNTER — Telehealth: Payer: Self-pay

## 2017-02-10 NOTE — Telephone Encounter (Signed)
Pt aware she needs to be seen weekly until beta in wnl. Pt is not bleeding but having cramps. If cramps not relived with ibup she will need to be seen. Lab appt made for 1/15 at 8am.

## 2017-02-14 ENCOUNTER — Other Ambulatory Visit: Payer: Medicaid Other

## 2017-03-02 ENCOUNTER — Encounter: Payer: Self-pay | Admitting: Certified Nurse Midwife

## 2017-03-02 ENCOUNTER — Ambulatory Visit (INDEPENDENT_AMBULATORY_CARE_PROVIDER_SITE_OTHER): Payer: Medicaid Other | Admitting: Certified Nurse Midwife

## 2017-03-02 ENCOUNTER — Ambulatory Visit (INDEPENDENT_AMBULATORY_CARE_PROVIDER_SITE_OTHER): Payer: Medicaid Other

## 2017-03-02 VITALS — BP 121/87 | HR 81 | Wt 186.1 lb

## 2017-03-02 DIAGNOSIS — N926 Irregular menstruation, unspecified: Secondary | ICD-10-CM | POA: Diagnosis not present

## 2017-03-02 DIAGNOSIS — O039 Complete or unspecified spontaneous abortion without complication: Secondary | ICD-10-CM

## 2017-03-02 DIAGNOSIS — Z09 Encounter for follow-up examination after completed treatment for conditions other than malignant neoplasm: Secondary | ICD-10-CM

## 2017-03-02 NOTE — Patient Instructions (Addendum)
Miscarriage A miscarriage is the loss of an unborn baby (fetus) before the 20th week of pregnancy. The cause is often unknown. Follow these instructions at home:  You may need to stay in bed (bed rest), or you may be able to do light activity. Go about activity as told by your doctor.  Have help at home.  Write down how many pads you use each day. Write down how soaked they are.  Do not use tampons. Do not wash out your vagina (douche) or have sex (intercourse) until your doctor approves.  Only take medicine as told by your doctor.  Do not take aspirin.  Keep all doctor visits as told.  If you or your partner have problems with grieving, talk to your doctor. You can also try counseling. Give yourself time to grieve before trying to get pregnant again. Get help right away if:  You have bad cramps or pain in your back or belly (abdomen).  You have a fever.  You pass large clumps of blood (clots) from your vagina that are walnut-sized or larger. Save the clumps for your doctor to see.  You pass large amounts of tissue from your vagina. Save the tissue for your doctor to see.  You have more bleeding.  You have thick, bad-smelling fluid (discharge) coming from the vagina.  You get lightheaded, weak, or you pass out (faint).  You have chills. This information is not intended to replace advice given to you by your health care provider. Make sure you discuss any questions you have with your health care provider. Document Released: 04/11/2011 Document Revised: 06/25/2015 Document Reviewed: 02/17/2011 Elsevier Interactive Patient Education  2017 [place] 18-39 Years, Female Preventive care refers to lifestyle choices and visits with your health care provider that can promote health and wellness. What does preventive care include?  A yearly physical exam. This is also called an annual well check.  Dental exams once or twice a year.  Routine eye exams. Ask  your health care provider how often you should have your eyes checked.  Personal lifestyle choices, including: ? Daily care of your teeth and gums. ? Regular physical activity. ? Eating a healthy diet. ? Avoiding tobacco and drug use. ? Limiting alcohol use. ? Practicing safe sex. ? Taking vitamin and mineral supplements as recommended by your health care provider. What happens during an annual well check? The services and screenings done by your health care provider during your annual well check will depend on your age, overall health, lifestyle risk factors, and family history of disease. Counseling Your health care provider may ask you questions about your:  Alcohol use.  Tobacco use.  Drug use.  Emotional well-being.  Home and relationship well-being.  Sexual activity.  Eating habits.  Work and work Statistician.  Method of birth control.  Menstrual cycle.  Pregnancy history.  Screening You may have the following tests or measurements:  Height, weight, and BMI.  Diabetes screening. This is done by checking your blood sugar (glucose) after you have not eaten for a while (fasting).  Blood pressure.  Lipid and cholesterol levels. These may be checked every 5 years starting at age 27.  Skin check.  Hepatitis C blood test.  Hepatitis B blood test.  Sexually transmitted disease (STD) testing.  BRCA-related cancer screening. This may be done if you have a family history of breast, ovarian, tubal, or peritoneal cancers.  Pelvic exam and Pap test. This may be done every 3 years starting  at age 70. Starting at age 42, this may be done every 5 years if you have a Pap test in combination with an HPV test.  Discuss your test results, treatment options, and if necessary, the need for more tests with your health care provider. Vaccines Your health care provider may recommend certain vaccines, such as:  Influenza vaccine. This is recommended every year.  Tetanus,  diphtheria, and acellular pertussis (Tdap, Td) vaccine. You may need a Td booster every 10 years.  Varicella vaccine. You may need this if you have not been vaccinated.  HPV vaccine. If you are 65 or younger, you may need three doses over 6 months.  Measles, mumps, and rubella (MMR) vaccine. You may need at least one dose of MMR. You may also need a second dose.  Pneumococcal 13-valent conjugate (PCV13) vaccine. You may need this if you have certain conditions and were not previously vaccinated.  Pneumococcal polysaccharide (PPSV23) vaccine. You may need one or two doses if you smoke cigarettes or if you have certain conditions.  Meningococcal vaccine. One dose is recommended if you are age 42-21 years and a first-year college student living in a residence hall, or if you have one of several medical conditions. You may also need additional booster doses.  Hepatitis A vaccine. You may need this if you have certain conditions or if you travel or work in places where you may be exposed to hepatitis A.  Hepatitis B vaccine. You may need this if you have certain conditions or if you travel or work in places where you may be exposed to hepatitis B.  Haemophilus influenzae type b (Hib) vaccine. You may need this if you have certain risk factors.  Talk to your health care provider about which screenings and vaccines you need and how often you need them. This information is not intended to replace advice given to you by your health care provider. Make sure you discuss any questions you have with your health care provider. Document Released: 03/15/2001 Document Revised: 10/07/2015 Document Reviewed: 11/18/2014 Elsevier Interactive Patient Education  [employer].

## 2017-03-03 LAB — BETA HCG QUANT (REF LAB): hCG Quant: <1 m[IU]/mL

## 2017-03-04 NOTE — Progress Notes (Signed)
GYN ENCOUNTER NOTE  Subjective:       Heidi Douglas is a 32 y.o. G3P2 female here for follow up after ultrasound.   Denies difficulty breathing or respiratory distress, chest pain, abdominal pain, vaginal bleeding, dysuria and leg or pain.   Gynecologic History  Patient's last menstrual period was 12/23/2016.  Contraception: none  Obstetric History  OB History  Gravida Para Term Preterm AB Living  3 2          SAB TAB Ectopic Multiple Live Births               # Outcome Date GA Lbr Len/2nd Weight Sex Delivery Anes PTL Lv  3 Current           2 Para           1 Para               No past medical history on file.  Past Surgical History:  Procedure Laterality Date  . KNEE SURGERY      Current Outpatient Medications on File Prior to Visit  Medication Sig Dispense Refill  . ondansetron (ZOFRAN ODT) 4 MG disintegrating tablet Take 1 tablet (4 mg total) by mouth every 8 (eight) hours as needed for nausea or vomiting. (Patient not taking: Reported on 02/02/2017) 20 tablet 0   No current facility-administered medications on file prior to visit.     No Known Allergies  Social History   Socioeconomic History  . Marital status: Single    Spouse name: Not on file  . Number of children: Not on file  . Years of education: Not on file  . Highest education level: Not on file  Social Needs  . Financial resource strain: Not on file  . Food insecurity - worry: Not on file  . Food insecurity - inability: Not on file  . Transportation needs - medical: Not on file  . Transportation needs - non-medical: Not on file  Occupational History  . Not on file  Tobacco Use  . Smoking status: Never Smoker  . Smokeless tobacco: Never Used  Substance and Sexual Activity  . Alcohol use: No    Frequency: Never  . Drug use: No  . Sexual activity: Yes    Birth control/protection: None  Other Topics Concern  . Not on file  Social History Narrative  . Not on file    No family  history on file.  The following portions of the patient's history were reviewed and updated as appropriate: allergies, current medications, past family history, past medical history, past social history, past surgical history and problem list.  Review of Systems  Review of Systems - Negative except as noted above.  History obtained from the patient.   Objective:   BP 121/87   Pulse 81   Wt 186 lb 1.6 oz (84.4 kg)   LMP 12/23/2016   BMI 32.97 kg/m   General: Alert and oriented x 4, no apparent distress.  Recent Results (from the past 2160 hour(s))  Chlamydia/NGC rt PCR ([place] only)     Status: None   Collection Time: 01/27/17 11:49 AM  Result Value Ref Range   Specimen source GC/Chlam URINE, RANDOM    Chlamydia Tr NOT DETECTED NOT DETECTED   N gonorrhoeae NOT DETECTED NOT DETECTED    Comment: (NOTE) 100  This methodology has not been evaluated in pregnant women or in 200  patients with a history of hysterectomy. 300 400  This methodology will not  be performed on patients less than 7  years of age. Performed at [facility], [address] [place], [place] [address]   Lipase, blood     Status: None   Collection Time: 01/27/17 11:55 AM  Result Value Ref Range   Lipase 24 11 - 51 U/L    Comment: Performed at [facility], [address] [place], [place] [address]  Comprehensive metabolic panel     Status: Abnormal   Collection Time: 01/27/17 11:55 AM  Result Value Ref Range   Sodium 137 135 - 145 mmol/L   Potassium 3.5 3.5 - 5.1 mmol/L   Chloride 106 101 - 111 mmol/L   CO2 26 22 - 32 mmol/L   Glucose, Bld 77 65 - 99 mg/dL   BUN 12 6 - 20 mg/dL   Creatinine, Ser 0.75 0.44 - 1.00 mg/dL   Calcium 9.0 8.9 - 10.3 mg/dL   Total Protein 7.6 6.5 - 8.1 g/dL   Albumin 3.9 3.5 - 5.0 g/dL   AST 14 (L) 15 - 41 U/L   ALT 12 (L) 14 - 54 U/L   Alkaline Phosphatase 54 38 - 126 U/L   Total Bilirubin 0.6 0.3 - 1.2 mg/dL   GFR calc non Af Amer >60 >60 mL/min    GFR calc Af Amer >60 >60 mL/min    Comment: (NOTE) The eGFR has been calculated using the CKD EPI equation. This calculation has not been validated in all clinical situations. eGFR's persistently <60 mL/min signify possible Chronic Kidney Disease.    Anion gap 5 5 - 15    Comment: Performed at [facility], [address] [place], [place] [address]  CBC     Status: None   Collection Time: 01/27/17 11:55 AM  Result Value Ref Range   WBC 8.1 3.6 - 11.0 K/uL   RBC 4.16 3.80 - 5.20 MIL/uL   Hemoglobin 13.3 12.0 - 16.0 g/dL   HCT 40.1 35.0 - 47.0 %   MCV 96.5 80.0 - 100.0 fL   MCH 32.0 26.0 - 34.0 pg   MCHC 33.2 32.0 - 36.0 g/dL   RDW 12.5 11.5 - 14.5 %   Platelets 297 150 - 440 K/uL    Comment: Performed at [facility], [address] [place], [place] [address]  Urinalysis, Complete w Microscopic     Status: Abnormal   Collection Time: 01/27/17 11:59 AM  Result Value Ref Range   Color, Urine YELLOW (A) YELLOW   APPearance CLEAR (A) CLEAR   Specific Gravity, Urine 1.023 1.005 - 1.030   pH 6.0 5.0 - 8.0   Glucose, UA NEGATIVE NEGATIVE mg/dL   Hgb urine dipstick NEGATIVE NEGATIVE   Bilirubin Urine NEGATIVE NEGATIVE   Ketones, ur NEGATIVE NEGATIVE mg/dL   Protein, ur NEGATIVE NEGATIVE mg/dL   Nitrite NEGATIVE NEGATIVE   Leukocytes, UA NEGATIVE NEGATIVE   RBC / HPF 0-5 0 - 5 RBC/hpf   WBC, UA 0-5 0 - 5 WBC/hpf   Bacteria, UA MANY (A) NONE SEEN   Squamous Epithelial / LPF 0-5 (A) NONE SEEN   Mucus PRESENT     Comment: Performed at [facility], [address] [place], [place] [address]  hCG, quantitative, pregnancy     Status: Abnormal   Collection Time: 01/27/17 12:04 PM  Result Value Ref Range   hCG, Beta Chain, Quant, S 160 (H) <5 mIU/mL    Comment:          GEST. AGE  CONC.  (mIU/mL)   <=1 WEEK        5 - 50     2 WEEKS       50 - 500     3 WEEKS       100 - 10,000     4 WEEKS     1,000 - 30,000     5 WEEKS     3,500 - 115,000   6-8  WEEKS     12,000 - 270,000    12 WEEKS     15,000 - 220,000        FEMALE AND NON-PREGNANT FEMALE:     LESS THAN 5 mIU/mL Performed at [facility], [address] [place], [place] [address]   Pregnancy, urine POC     Status: None   Collection Time: 01/27/17 12:08 PM  Result Value Ref Range   Preg Test, Ur NEGATIVE NEGATIVE    Comment:        THE SENSITIVITY OF THIS METHODOLOGY IS >24 mIU/mL   hCG, quantitative, pregnancy     Status: Abnormal   Collection Time: 01/29/17 12:28 AM  Result Value Ref Range   hCG, Beta Chain, Quant, S 128 (H) <5 mIU/mL    Comment:          GEST. AGE      CONC.  (mIU/mL)   <=1 WEEK        5 - 50     2 WEEKS       50 - 500     3 WEEKS       100 - 10,000     4 WEEKS     1,000 - 30,000     5 WEEKS     3,500 - 115,000   6-8 WEEKS     12,000 - 270,000    12 WEEKS     15,000 - 220,000        FEMALE AND NON-PREGNANT FEMALE:     LESS THAN 5 mIU/mL Performed at [facility], [address] [place], [place] [address]   Basic metabolic panel     Status: None   Collection Time: 01/29/17 12:28 AM  Result Value Ref Range   Sodium 137 135 - 145 mmol/L   Potassium 3.7 3.5 - 5.1 mmol/L   Chloride 105 101 - 111 mmol/L   CO2 24 22 - 32 mmol/L   Glucose, Bld 97 65 - 99 mg/dL   BUN 14 6 - 20 mg/dL   Creatinine, Ser 0.69 0.44 - 1.00 mg/dL   Calcium 9.3 8.9 - 10.3 mg/dL   GFR calc non Af Amer >60 >60 mL/min   GFR calc Af Amer >60 >60 mL/min    Comment: (NOTE) The eGFR has been calculated using the CKD EPI equation. This calculation has not been validated in all clinical situations. eGFR's persistently <60 mL/min signify possible Chronic Kidney Disease.    Anion gap 8 5 - 15    Comment: Performed at [facility], [address] [place], [place] [address]  Urinalysis, Complete w Microscopic     Status: Abnormal   Collection Time: 01/29/17 12:28 AM  Result Value Ref Range   Color, Urine YELLOW (A) YELLOW   APPearance CLEAR (A)  CLEAR   Specific Gravity, Urine 1.030 1.005 - 1.030   pH 5.0 5.0 - 8.0   Glucose, UA NEGATIVE NEGATIVE mg/dL   Hgb urine dipstick NEGATIVE NEGATIVE   Bilirubin Urine NEGATIVE NEGATIVE  Ketones, ur NEGATIVE NEGATIVE mg/dL   Protein, ur NEGATIVE NEGATIVE mg/dL   Nitrite NEGATIVE NEGATIVE   Leukocytes, UA NEGATIVE NEGATIVE   RBC / HPF NONE SEEN 0 - 5 RBC/hpf   WBC, UA 0-5 0 - 5 WBC/hpf   Bacteria, UA NONE SEEN NONE SEEN   Squamous Epithelial / LPF 0-5 (A) NONE SEEN   Mucus PRESENT     Comment: Performed at [facility], [address] [place], [place] [address]  Pregnancy, urine POC     Status: None   Collection Time: 01/29/17 12:37 AM  Result Value Ref Range   Preg Test, Ur NEGATIVE NEGATIVE    Comment:        THE SENSITIVITY OF THIS METHODOLOGY IS >24 mIU/mL   hCG, quantitative, pregnancy     Status: Abnormal   Collection Time: 01/30/17 12:53 PM  Result Value Ref Range   hCG, Beta Chain, Quant, S 131 (H) <5 mIU/mL    Comment:          GEST. AGE      CONC.  (mIU/mL)   <=1 WEEK        5 - 50     2 WEEKS       50 - 500     3 WEEKS       100 - 10,000     4 WEEKS     1,000 - 30,000     5 WEEKS     3,500 - 115,000   6-8 WEEKS     12,000 - 270,000    12 WEEKS     15,000 - 220,000        FEMALE AND NON-PREGNANT FEMALE:     LESS THAN 5 mIU/mL Performed at [facility], [address] [place], [place] [address]   ABO/Rh     Status: None   Collection Time: 01/30/17 12:53 PM  Result Value Ref Range   ABO/RH(D)      A POS Performed at [facility], [address] [place], [place] [address]   Beta HCG, Quant     Status: None   Collection Time: 02/02/17 10:33 AM  Result Value Ref Range   hCG Quant 99 mIU/mL    Comment:                      Female (Non-pregnant)    0 -     5                             (Postmenopausal)  0 -     8                      Female (Pregnant)                      Weeks of Gestation                              3                 6 -    71                              4  10 -   750                              5              217 -  7138                              6              [place] [facility] CLIA methodology   Beta HCG, Quant     Status: None   Collection Time: 02/07/17 11:18 AM  Result Value Ref Range   hCG Quant 59 mIU/mL    Comment:                      Female (Non-pregnant)    0 -     5                             (  Postmenopausal)  0 -     8                      Female (Pregnant)                      Weeks of Gestation                              3                6 -    71                              4               10 -   750                              5              217 -  7138                              6              158 - [place]                              7             [id number] -[id number]                              8            [id number] -[id number]                              9            [place] CLIA methodology   Beta  HCG, Quant     Status: None   Collection Time: 03/02/17 11:52 AM  Result Value Ref Range   hCG Quant <1 mIU/mL    Comment:                      Female (Non-pregnant)    0 -     5                             (Postmenopausal)  0 -     8                      Female (Pregnant)                      Weeks of Gestation                              3                6 -    71                              4               10 -   750                              5              217 -  7138                              6              158 - [address]                              7             [phone] -[id number]                              8            [id number] -[id number]                              0            [id number] -[id number]                             10            [place] CLIA methodology       ULTRASOUND REPORT  Location: [facility] Date of Service:  03/02/2017   Indications: Dating/Viability Findings:  The uterus measures 10.9 x 5.2 x 5.5 cm. Echo texture is homogeneous without evidence of focal masses. The Endometrium measures 5.1 mm.  A gestational sac is not identified.  Right Ovary measures 3.7 x 2.5 x 2.1 cm. It is normal in appearance. Left Ovary measures 4.7 x 4.7 x 3.2 cm and contains a complex cystic structure measuring 3.6 x 2.8 x 2.5 cm (? Degenerating corpus luteal cyst).  Survey of the adnexa demonstrates no adnexal masses. There is no free fluid in the cul de sac.  Impression: 1. Anteverted uterus appears of normal size and contour. 2. The endometrium measures 5.1 mm. 3. Left ovary contains a complex cystic structure  (possible degenerating corpus luteal cyst) measuring 3.6 x 2.8 x 2.5 cm. 4. A gestational sac is not identified. 5. Patient added to Michelle's schedule for her to discuss findings. 6. Findings do not correlate with clinical (LMP) EDD of 09/16/17.  Recommendations: 1.Clinical correlation with the patient's History and Physical Exam.  Assessment:   1. Complete abortion  2. Follow up - Beta HCG, Quant  Plan:   Labs and ultrasound results reviewed with patient.   Condolences offered.   Advised two (2) to three (3) normal cycles before trying to conceive again.   Reviewed red flag symptoms and when to call.   RTC as needed.    Diona Fanti, CNM [facility], [facility]

## 2018-02-21 ENCOUNTER — Encounter: Payer: Self-pay | Admitting: Emergency Medicine

## 2018-02-21 ENCOUNTER — Emergency Department: Payer: Self-pay

## 2018-02-21 ENCOUNTER — Emergency Department
Admission: EM | Admit: 2018-02-21 | Discharge: 2018-02-21 | Disposition: A | Discharge status: Home / Self Care | Payer: Self-pay | Attending: Emergency Medicine | Admitting: Emergency Medicine

## 2018-02-21 DIAGNOSIS — Z3A01 Less than 8 weeks gestation of pregnancy: Secondary | ICD-10-CM | POA: Insufficient documentation

## 2018-02-21 DIAGNOSIS — O039 Complete or unspecified spontaneous abortion without complication: Secondary | ICD-10-CM | POA: Insufficient documentation

## 2018-02-21 DIAGNOSIS — R102 Pelvic and perineal pain: Secondary | ICD-10-CM | POA: Insufficient documentation

## 2018-02-21 DIAGNOSIS — O26891 Other specified pregnancy related conditions, first trimester: Secondary | ICD-10-CM | POA: Insufficient documentation

## 2018-02-21 LAB — HCG, QUANTITATIVE, PREGNANCY: hCG, Beta Chain, Quant, S: 496 m[IU]/mL — ABNORMAL HIGH (ref <5)

## 2018-02-21 MED ORDER — HYDROCODONE-ACETAMINOPHEN 5-325 MG PO TABS: 1.0000 | ORAL_TABLET | ORAL | 0 refills | Status: DC | PRN

## 2018-02-21 NOTE — ED Notes (Addendum)
Pt states she took a pregnancy test at home and believes she is [redacted] weeks pregnant. Pt states she noticed light bleeding since Sunday 02/18/2018 and progressively getting worse, pt has noticed brigth red blood and changing pads 3X/day. Pt reports dizziness and lightheaded; lower abd pain 8/10 radiates to lower back. Pt has been taking advil at home for pain. NAD noted at this time. VSS

## 2018-02-21 NOTE — ED Triage Notes (Signed)
Pt reports is approximately [redacted] weeks pregnant and started with vaginal bleeding and cramping yesterday. Pt with hx of miscarriage.

## 2018-02-21 NOTE — ED Provider Notes (Addendum)
[facility] Emergency Department Provider Note  Time seen: 12:49 PM  I have reviewed the triage vital signs and the nursing notes.   HISTORY  Chief Complaint Possible Pregnancy; Vaginal Bleeding; and Abdominal Cramping    HPI Heidi Douglas is a 33 y.o. female with no significant past medical history G4, P2 A1 presents to the emergency department approximately [redacted] weeks pregnant with vaginal bleeding lower abdominal cramping.  According to the patient her last menstrual period was around November 20, however approximate 3 days ago she began with vaginal bleeding/spotting.  States she took a home pregnancy test last week which was positive.  Has not yet seen an OB/GYN.  States mild lower abdominal cramping consistent with menstrual cramping.  Denies any significant discharge but does state vaginal spotting over the past 3 days.   History reviewed. No pertinent past medical history.  There are no active problems to display for this patient.   Past Surgical History:  Procedure Laterality Date  . KNEE SURGERY      Prior to Admission medications   Medication Sig Start Date End Date Taking? Authorizing Provider  ondansetron (ZOFRAN ODT) 4 MG disintegrating tablet Take 1 tablet (4 mg total) by mouth every 8 (eight) hours as needed for nausea or vomiting. Patient not taking: Reported on 02/02/2017 01/27/17   Emily Filbert, MD    No Known Allergies  No family history on file.  Social History Social History   Tobacco Use  . Smoking status: Never Smoker  . Smokeless tobacco: Never Used  Substance Use Topics  . Alcohol use: No    Frequency: Never  . Drug use: No    Review of Systems Constitutional: Negative for fever. Cardiovascular: Negative for chest pain. Respiratory: Negative for shortness of breath. Gastrointestinal: Mild lower abdominal pain.  Negative for nausea vomiting or diarrhea Genitourinary: Positive for vaginal  bleeding Musculoskeletal: Negative for musculoskeletal complaints Skin: Negative for skin complaints  Neurological: Negative for headache All other ROS negative  ____________________________________________   PHYSICAL EXAM:  VITAL SIGNS: ED Triage Vitals  Enc Vitals Group     BP 02/21/18 1104 (!) 149/89     Pulse Rate 02/21/18 1104 85     Resp 02/21/18 1104 16     Temp 02/21/18 1104 98.3 F (36.8 C)     Temp Source 02/21/18 1104 Oral     SpO2 02/21/18 1104 100 %     Weight 02/21/18 1044 180 lb (81.6 kg)     Height 02/21/18 1044 5\' 3"  (1.6 m)     Head Circumference --      Peak Flow --      Pain Score 02/21/18 1044 8     Pain Loc --      Pain Edu? --      Excl. in GC? --    Constitutional: Alert and oriented. Well appearing and in no distress. Eyes: Normal exam ENT   Head: Normocephalic and atraumatic.   Mouth/Throat: Mucous membranes are moist. Cardiovascular: Normal rate, regular rhythm. No murmur Respiratory: Normal respiratory effort without tachypnea nor retractions. Breath sounds are clear Gastrointestinal: Soft, minimal lower abdominal tenderness, no rebound guarding or distention. Musculoskeletal: Nontender with normal range of motion in all extremities.  Neurologic:  Normal speech and language. No gross focal neurologic deficits Skin:  Skin is warm, dry and intact.  Psychiatric: Mood and affect are normal.  ____________________________________________   RADIOLOGY  Small gestational sac with 7.3 mm fetal pole no cardiac activity,  findings meet criteria for failed pregnancy.  ____________________________________________   INITIAL IMPRESSION / ASSESSMENT AND PLAN / ED COURSE  Pertinent labs & imaging results that were available during my care of the patient were reviewed by me and considered in my medical decision making (see chart for details).  Patient presents to the emergency department [redacted] weeks pregnant with vaginal bleeding and lower abdominal  cramping.  Differential would include threatened miscarriage, miscarriage, ectopic pregnancy.  We will check labs including beta hCG.  I reviewed past ABO Rh status, does not require RhoGam.  We will obtain an ultrasound to further evaluate.  Patient agreeable to plan of care.  Beta-hCG 490.  Ultrasound consistent with failed pregnancy.  We will discharge patient home with PCP follow-up.  Patient is requesting a work note with the diagnosis on the work note which I will provide for the patient as well as a short course of pain medication as the patient states her prior miscarriage was very painful cramping.  ____________________________________________   FINAL CLINICAL IMPRESSION(S) / ED DIAGNOSES  Miscarriage   Minna Antis, MD 02/21/18 1411    Minna Antis, MD 02/21/18 1423

## 2018-02-21 NOTE — Discharge Instructions (Addendum)
Please follow-up with your primary care doctor for recheck/reevaluation within 1 week if needed.  Return to the emergency department for any significant abdominal pain or significant increase in vaginal bleeding, or any other symptom personally concerning to yourself.

## 2018-02-21 NOTE — ED Notes (Signed)
Patient transported to Ultrasound 

## 2018-04-28 ENCOUNTER — Encounter (HOSPITAL_COMMUNITY): Payer: Self-pay | Admitting: Emergency Medicine

## 2018-04-28 ENCOUNTER — Other Ambulatory Visit: Payer: Self-pay

## 2018-04-28 ENCOUNTER — Emergency Department (HOSPITAL_COMMUNITY)
Admission: EM | Admit: 2018-04-28 | Discharge: 2018-04-28 | Disposition: A | Discharge status: Home / Self Care | Payer: Self-pay | Attending: Emergency Medicine | Admitting: Emergency Medicine

## 2018-04-28 DIAGNOSIS — R0981 Nasal congestion: Secondary | ICD-10-CM | POA: Insufficient documentation

## 2018-04-28 DIAGNOSIS — H6122 Impacted cerumen, left ear: Secondary | ICD-10-CM | POA: Insufficient documentation

## 2018-04-28 DIAGNOSIS — H9202 Otalgia, left ear: Secondary | ICD-10-CM | POA: Insufficient documentation

## 2018-04-28 MED ORDER — HYDROGEN PEROXIDE 3 % EX SOLN
CUTANEOUS | Status: AC
  Administered 2018-04-28: 1
  Filled 2018-04-28: qty 473

## 2018-04-28 MED ORDER — AMOXICILLIN 500 MG PO CAPS: 500.0000 mg | ORAL_CAPSULE | Freq: Three times a day (TID) | ORAL | 0 refills | Status: DC

## 2018-04-28 NOTE — ED Notes (Signed)
Waxed removed from left ear.

## 2018-04-28 NOTE — ED Provider Notes (Signed)
[facility] EMERGENCY DEPARTMENT Provider Note   CSN: [id number] Arrival date & time: 04/28/18  1345    History   Chief Complaint Chief Complaint  Patient presents with  . Otalgia    HPI Heidi Douglas is a 33 y.o. female.     The history is provided by the patient.  Otalgia  Location:  Left Behind ear:  No abnormality Quality:  Aching and pressure Severity:  Moderate Onset quality:  Gradual Duration:  3 days Timing:  Intermittent Progression:  Worsening Chronicity:  New Context: not direct blow and not foreign body in ear   Relieved by:  Nothing Worsened by:  Nothing Ineffective treatments: tylenol. Associated symptoms: congestion   Associated symptoms: no abdominal pain, no cough, no ear discharge, no fever, no neck pain, no sore throat, no tinnitus and no vomiting   Risk factors: no recent travel     History reviewed. No pertinent past medical history.  There are no active problems to display for this patient.   Past Surgical History:  Procedure Laterality Date  . KNEE SURGERY       OB History    Gravida  3   Para  2   Term      Preterm      AB  1   Living        SAB  1   TAB      Ectopic      Multiple      Live Births               Home Medications    Prior to Admission medications   Medication Sig Start Date End Date Taking? Authorizing Provider  acetaminophen (TYLENOL) 500 MG tablet Take 1,000 mg by mouth every 6 (six) hours as needed for moderate pain.   Yes [provider]  HYDROcodone-acetaminophen (NORCO/VICODIN) 5-325 MG tablet Take 1 tablet by mouth every 4 (four) hours as needed. Patient not taking: Reported on 04/28/2018 02/21/18   Minna Antis, MD  ondansetron (ZOFRAN ODT) 4 MG disintegrating tablet Take 1 tablet (4 mg total) by mouth every 8 (eight) hours as needed for nausea or vomiting. Patient not taking: Reported on 02/02/2017 01/27/17   Emily Filbert, MD    Family History No family  history on file.  Social History Social History   Tobacco Use  . Smoking status: Never Smoker  . Smokeless tobacco: Never Used  Substance Use Topics  . Alcohol use: No    Frequency: Never  . Drug use: No     Allergies   Patient has no known allergies.   Review of Systems Review of Systems  Constitutional: Negative for activity change and fever.       All ROS Neg except as noted in HPI  HENT: Positive for congestion and ear pain. Negative for ear discharge, nosebleeds, sore throat and tinnitus.   Eyes: Negative for photophobia and discharge.  Respiratory: Negative for cough, shortness of breath and wheezing.   Cardiovascular: Negative for chest pain and palpitations.  Gastrointestinal: Negative for abdominal pain, blood in stool and vomiting.  Genitourinary: Negative for dysuria, frequency and hematuria.  Musculoskeletal: Negative for arthralgias, back pain and neck pain.  Skin: Negative.   Neurological: Negative for dizziness, seizures and speech difficulty.  Psychiatric/Behavioral: Negative for confusion and hallucinations.     Physical Exam Updated Vital Signs BP 123/79   Pulse 80   Temp 98 F (36.7 C) (Oral)   Resp 16  Ht 5\' 4"  (1.626 m)   Wt 81.6 kg   LMP 04/25/2018   SpO2 100%   Breastfeeding Unknown   BMI 30.90 kg/m   Physical Exam Vitals signs and nursing note reviewed.  Constitutional:      Appearance: She is well-developed. She is not toxic-appearing.  HENT:     Head: Normocephalic.     Right Ear: Tympanic membrane and external ear normal.     Left Ear: External ear normal. There is impacted cerumen.     Nose: Congestion present.  Eyes:     General: Lids are normal.     Pupils: Pupils are equal, round, and reactive to light.  Neck:     Musculoskeletal: Normal range of motion and neck supple.     Vascular: No carotid bruit.  Cardiovascular:     Rate and Rhythm: Normal rate and regular rhythm.     Pulses: Normal pulses.     Heart sounds:  Normal heart sounds.  Pulmonary:     Effort: No respiratory distress.     Breath sounds: Normal breath sounds.  Abdominal:     General: Bowel sounds are normal.     Palpations: Abdomen is soft.     Tenderness: There is no abdominal tenderness. There is no guarding.  Musculoskeletal: Normal range of motion.  Lymphadenopathy:     Head:     Right side of head: No submandibular adenopathy.     Left side of head: No submandibular adenopathy.     Cervical: No cervical adenopathy.  Skin:    General: Skin is warm and dry.  Neurological:     Mental Status: She is alert and oriented to person, place, and time.     Cranial Nerves: No cranial nerve deficit.     Sensory: No sensory deficit.  Psychiatric:        Speech: Speech normal.      ED Treatments / Results  Labs (all labs ordered are listed, but only abnormal results are displayed) Labs Reviewed - No data to display  EKG None  Radiology No results found.  Procedures Procedures (including critical care time)  Medications Ordered in ED Medications  hydrogen peroxide 3 % external solution (1 application  Given 04/28/18 1539)     Initial Impression / Assessment and Plan / ED Course  I have reviewed the triage vital signs and the nursing notes.  Pertinent labs & imaging results that were available during my care of the patient were reviewed by me and considered in my medical decision making (see chart for details).          Final Clinical Impressions(s) / ED Diagnoses MDM  Patient presents to the emergency department complaining of ear pain on the left.  Vital signs within normal limits.  Examination shows cerumen impaction.  The majority of the cerumen impaction was removed by irrigation by the nursing staff.  Examination after cerumen removal shows some increased redness of the tympanic membrane.  And some increased redness of the external canal.  The patient will be treated with Amoxil.  Patient is also going to  use Tylenol every 4 hours or ibuprofen every 6 hours for discomfort.  The patient is advised to return to the emergency department if the pain is worsening, if there is drainage or discharge, problems, or concerns.  Patient is in agreement with this plan.   Final diagnoses:  Otalgia of left ear  Impacted cerumen of left ear    ED Discharge Orders  None       Ivery Quale, PA-C 04/29/18 3846    Pricilla Loveless, MD 04/29/18 1540

## 2018-04-28 NOTE — ED Triage Notes (Signed)
Patient c/o left ear pain/fullness x3 days with some drainage. Denies any fevers. Per patient took tylenol this morning at 2am with slight relief.

## 2018-04-28 NOTE — ED Notes (Signed)
One application of peroxide in left ear. Letting soak, will remove wax after

## 2018-04-28 NOTE — Discharge Instructions (Addendum)
Please do not put anything in your ear other than your wash cough and liquid cleaning solutions.  Please use Amoxil 3 times daily with food.  Use Tylenol every 4 hours or ibuprofen every 6 hours for discomfort.  Please see your primary physician or return to the emergency department if any worsening of your symptoms, problems, or concerns.

## 2018-05-01 ENCOUNTER — Ambulatory Visit (INDEPENDENT_AMBULATORY_CARE_PROVIDER_SITE_OTHER): Payer: Medicaid Other | Admitting: Family Medicine

## 2018-05-01 ENCOUNTER — Encounter: Payer: Self-pay | Admitting: Family Medicine

## 2018-05-01 ENCOUNTER — Other Ambulatory Visit: Payer: Self-pay

## 2018-05-01 DIAGNOSIS — Z7689 Persons encountering health services in other specified circumstances: Secondary | ICD-10-CM

## 2018-05-01 DIAGNOSIS — J0100 Acute maxillary sinusitis, unspecified: Secondary | ICD-10-CM

## 2018-05-01 MED ORDER — IPRATROPIUM BROMIDE 0.06 % NA SOLN: 2.0000 | Freq: Four times a day (QID) | NASAL | 0 refills | Status: DC

## 2018-05-01 MED ORDER — AMOXICILLIN-POT CLAVULANATE 875-125 MG PO TABS: 1.0000 | ORAL_TABLET | Freq: Two times a day (BID) | ORAL | 0 refills | Status: DC

## 2018-05-01 NOTE — Progress Notes (Signed)
Subjective:    Patient ID: Heidi Douglas, female    DOB: [birth year], 33 y.o.   MRN: [id number]  Heidi Douglas is a 33 y.o. female presenting on 05/01/2018 for Ear Pain (nasal congestion, itchy face, allergies on Abx from past 4 days denies fever but some chills denies cough, SOB onset 5 days--left side ear pain is worst)  Did not have a regular doctor previously, recently moved [place]. She lives in [place].  Virtual / Telehealth Encounter - Video Visit via WebEx The purpose of this virtual visit is to provide medical care while limiting exposure to the novel coronavirus (COVID19) for both patient and office staff.  Consent was obtained for phone visit:  Yes.   Answered questions that patient had about telehealth interaction:  Yes.   I discussed the limitations, risks, security and privacy concerns of performing an evaluation and management service by video/telephone. I also discussed with the patient that there may be a patient responsible charge related to this service. The patient expressed understanding and agreed to proceed.  Patient Location: Parked car - parking lot Provider Location: Chiropractor (Office)   HPI   ACUTE SINUSITIS, Left Ear impaction Reports symptoms started about 1 week ago, she had Left ear pain and pressure and loss of hearing, she was seen at [facility]) on 04/28/18 diagnosed with ear infection left sided and impaction by cerumen and they did flushing, treated with Amoxicillin antibiotic due to redness. She has had worsening drainage in sinus with congestion post nasal drainage, also admits seems to be "itchy" as well. Now after that treatment with flushing wax removal, she has had much improvement and her hearing has returned to normal - she can hear again. But now worsening with sinus symptoms. Describes worsening drainage and sinus pressure congestion. - No known allergies to medications - Takes OTC occasional  cetirizine, benadryl, allegra - only occasional relief of Zyrtec - she had taken Zyrtec for years in past limited relief. Also has Flonase using PRN not regularly. - She was sent home from work after ED visit - and she has not been back, missed last night due to still not feeling well - Admits sinus congestion and post nasal drain - Denies any significant coughing, shortness of breath, fever chills  Health Maintenance: Did not get flu vaccine this season.  Depression screen PHQ 2/9 05/01/2018  Decreased Interest 0  Down, Depressed, Hopeless 0  PHQ - 2 Score 0    Past Medical History:  Diagnosis Date  . Breathing problem    Past Surgical History:  Procedure Laterality Date  . KNEE SURGERY     Social History   Socioeconomic History  . Marital status: Single    Spouse name: Not on file  . Number of children: Not on file  . Years of education: Not on file  . Highest education level: Not on file  Occupational History  . Not on file  Social Needs  . Financial resource strain: Not on file  . Food insecurity:    Worry: Not on file    Inability: Not on file  . Transportation needs:    Medical: Not on file    Non-medical: Not on file  Tobacco Use  . Smoking status: Never Smoker  . Smokeless tobacco: Never Used  Substance and Sexual Activity  . Alcohol use: No    Frequency: Never  . Drug use: No  . Sexual activity: Yes  Birth control/protection: None  Lifestyle  . Physical activity:    Days per week: Not on file    Minutes per session: Not on file  . Stress: Not on file  Relationships  . Social connections:    Talks on phone: Not on file    Gets together: Not on file    Attends religious service: Not on file    Active member of club or organization: Not on file    Attends meetings of clubs or organizations: Not on file    Relationship status: Not on file  . Intimate partner violence:    Fear of current or ex partner: Not on file    Emotionally abused: Not on file     Physically abused: Not on file    Forced sexual activity: Not on file  Other Topics Concern  . Not on file  Social History Narrative  . Not on file   Family History  Problem Relation Age of Onset  . Asthma Sister   . Asthma Daughter    Current Outpatient Medications on File Prior to Visit  Medication Sig  . acetaminophen (TYLENOL) 500 MG tablet Take 1,000 mg by mouth every 6 (six) hours as needed for moderate pain.  Marland Kitchen albuterol (PROVENTIL HFA;VENTOLIN HFA) 108 (90 Base) MCG/ACT inhaler Inhale 2 puffs into the lungs every 6 (six) hours as needed for wheezing or shortness of breath.   No current facility-administered medications on file prior to visit.     Review of Systems Per HPI unless specifically indicated above     Objective:    LMP 04/25/2018   Wt Readings from Last 3 Encounters:  04/28/18 180 lb (81.6 kg)  02/21/18 180 lb (81.6 kg)  03/02/17 186 lb 1.6 oz (84.4 kg)    Physical Exam   Note examination was completely remotely via video observation objective data only  Gen - well-appearing, no acute distress or apparent pain HEENT - eyes appear clear without discharge or redness, nasal congestion audible on speaking Heart/Lungs - cannot examine virtually - observed no evidence of coughing or labored breathing. Abd - cannot examine virtually Neuro - awake, alert, oriented Psych - not anxious appearing      Assessment & Plan:   Problem List Items Addressed This Visit    None    Visit Diagnoses    Acute non-recurrent maxillary sinusitis    -  Primary   Relevant Medications   amoxicillin-clavulanate (AUGMENTIN) 875-125 MG tablet   ipratropium (ATROVENT) 0.06 % nasal spray   Encounter to establish care with new doctor          Consistent with acute maxillary rhinosinusitis, likely initially viral URI vs allergic rhinitis component with worsening concern for bacterial infection, worsening despite initial amoxil course from ED, she had L ear cerumen impaction  and pain, without evidence of TM rupture by report of ED note, ear symptoms pain and hearing resolved after ear flushing removal of cerumen.  LOW RISK for COVID given symptoms, not consistent not endorsing fever, cough, dyspnea, no known contacts  Plan: 1.  STOP Amoxil seems ineffective for sinus - Start Augmentin 875-125mg  PO BID x 7 days now 2. Start Atrovent nasal spray decongestant 2 sprays in each nostril up to 4 times daily for 7 days 3. Restart Cetirizine, Flonase 4. May use OTC decongestant oral sudafed behind counter 5. Supportive care OTC meds as needed  Work note for 3/30 through 4/1  Return on 4/2 - sent to [employer] ordered  this encounter  Medications  . amoxicillin-clavulanate (AUGMENTIN) 875-125 MG tablet    Sig: Take 1 tablet by mouth 2 (two) times daily. For 7 days. Stop previous Amoxicillin rx.    Dispense:  14 tablet    Refill:  0  . ipratropium (ATROVENT) 0.06 % nasal spray    Sig: Place 2 sprays into both nostrils 4 (four) times daily. For up to 5-7 days then stop.    Dispense:  15 mL    Refill:  0    Patient verbalizes understanding with the above medical recommendations including the limitation of remote medical advice.  Specific follow-up and call-back criteria were given for patient to follow-up or seek medical care more urgently if needed.  Total duration of video conference direct care provided to patient: 20 minutes   Saralyn Pilar, DO [facility] 05/01/2018, 2:16 PM

## 2018-05-01 NOTE — Patient Instructions (Signed)
1. It sounds like you have a Sinusitis (Bacterial Infection) - this most likely started as an Upper Respiratory Virus that has settled into an infection. Allergies can also cause this. - Start Augmentin 1 pill twice daily (breakfast and dinner, with food and plenty of water) for 10 days, complete entire course, do not stop early even if feeling better  Start Atrovent nasal spray decongestant 2 sprays in each nostril up to 4 times daily for 7 days  Start nasal steroid Flonase 2 sprays in each nostril daily for 4-6 weeks, may repeat course seasonally or as needed;on  Start Cetirizine zyrtec 10mg  daily  Try sudafed behind the counter, original sudafed decongestant  - Recommend to keep using Nasal Saline spray multiple times a day to help flush out congestion and clear sinuses - Improve hydration by drinking plenty of clear fluids (water, gatorade) to reduce secretions and thin congestion - Congestion draining down throat can cause irritation. May try warm herbal tea with honey, cough drops - Can take Tylenol or Ibuprofen as needed for fevers - May continue over the counter cold medicine as you are, I would not use any decongestant or mucinex longer than 7 days.  If you develop persistent fever >101F for at least 3 consecutive days, headaches with sinus pain or pressure or persistent earache, please schedule a follow-up evaluation within next few days to week.   If you have any other questions or concerns, please feel free to call the office or send a message through MyChart. You may also schedule an earlier appointment if necessary.  Additionally, you may be receiving a survey about your experience at our office within a few days to 1 week by e-mail or mail. We value your feedback.  Saralyn Pilar, DO [facility], [place]

## 2018-05-15 ENCOUNTER — Encounter: Payer: Self-pay | Admitting: Family Medicine

## 2018-05-15 ENCOUNTER — Other Ambulatory Visit: Payer: Self-pay

## 2018-05-15 ENCOUNTER — Ambulatory Visit (INDEPENDENT_AMBULATORY_CARE_PROVIDER_SITE_OTHER): Payer: Self-pay | Admitting: Family Medicine

## 2018-05-15 DIAGNOSIS — J3089 Other allergic rhinitis: Secondary | ICD-10-CM

## 2018-05-15 DIAGNOSIS — J019 Acute sinusitis, unspecified: Secondary | ICD-10-CM

## 2018-05-15 MED ORDER — FLUTICASONE PROPIONATE 50 MCG/ACT NA SUSP: 2.0000 | Freq: Every day | NASAL | 5 refills | Status: DC

## 2018-05-15 MED ORDER — MONTELUKAST SODIUM 10 MG PO TABS: 10.0000 mg | ORAL_TABLET | Freq: Every day | ORAL | 3 refills | Status: AC

## 2018-05-15 MED ORDER — AZELASTINE HCL 0.1 % NA SOLN: 1.0000 | Freq: Two times a day (BID) | NASAL | 3 refills | Status: DC

## 2018-05-15 NOTE — Patient Instructions (Addendum)
Likely allergic rhinitis symptoms   New medicine Singulair 10mg  nightly  Continue Cetirizine zyrtec daily  New nasal spray - Azelastine (Astelin) 1-2 spray each nostril daily  Continue Flonase - re ordered - nasal steroid Flonase 2 sprays in each nostril daily for 4-6 weeks, may repeat course seasonally or as needed  [facility] [address] [place], [place]  [address] Phone: [phone]  No sign of infection at this time - note to return to work Saturday  Notify if worsening fever chills shortness of breath  Please schedule a Follow-up Appointment to: No follow-ups on file.  If you have any other questions or concerns, please feel free to call the office or send a message through MyChart. You may also schedule an earlier appointment if necessary.  Additionally, you may be receiving a survey about your experience at our office within a few days to 1 week by e-mail or mail. We value your feedback.  Saralyn Pilar, DO [facility], [place]

## 2018-05-15 NOTE — Progress Notes (Signed)
Virtual Visit via Telephone The purpose of this virtual visit is to provide medical care while limiting exposure to the novel coronavirus (COVID19) for both patient and office staff.  Consent was obtained for phone visit:  Yes.   Answered questions that patient had about telehealth interaction:  Yes.   I discussed the limitations, risks, security and privacy concerns of performing an evaluation and management service by telephone. I also discussed with the patient that there may be a patient responsible charge related to this service. The patient expressed understanding and agreed to proceed.  Patient Location: Home Provider Location: [facility])   ---------------------------------------------------------------------- Chief Complaint  Patient presents with  . Sinusitis    no improvement same as last visit denies fever, little SOB Hx of bronchitis, asthma, stuffy nose, postnasal drainge onset 15 days    S: Reviewed CMA telephone note below. I have called patient and gathered additional HPI as follows:  FOLLOW-UP ALLERGIC RHINOSINUSITIS - Last visit with me 05/01/18 virtual visit as new patient, for initial visit for same problem, treated with switched Amoxil over to Augmentin and added Atrovent nasal, Flonase, Cetirizine, see prior notes for background information. - Interval update with no improvement, persistent drainage in back of throat, nasal drainage and rhinorrhea, frequent sneezing, itchy nose, productive cough due to drainage - Today patient reports persistent symptoms. She describes similar to her yearly seasonal allergies, but this year is just more severe, she does have some year round allergy symptoms, worse with certain environmental exposures. Never on allergy shots before. 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 Denies any known or suspected exposure to person with or possibly with COVID19.  Denies any fevers, chills, sweats, body ache, cough, shortness of breath, sinus pain or pressure, headache, abdominal pain, diarrhea  -------------------------------------------------------------------------- O: No physical exam performed due to remote telephone encounter.  -------------------------------------------------------------------------- A&P:   Suspected Acute Rhinosinusitis likely allergic trigger. With seasonal allergies uncontrolled, severe Unlikely secondary bacterial infection at this time based on history - Reassuring without high risk symptoms - Afebrile, without dyspnea - No comorbid pulmonary conditions (asthma, COPD) or immunocompromise  - Currently patient is LOW RISK for COVID19 based on current symptoms and no known travel/exposure - however at this time, now COVID19 is currently within phase of community spread, and therefore patient can still be potentially exposed. Cannot rule out case with mild symptoms. Testing is not recommended at this time due to mild symptoms.  1. START Singulair 10mg  daily 2. START Azelastine 0.1% nasal spray 3. Re order Flonase nasal 2 spray each nostril daily, longer term 4. Finished Atrovent, Augmentin - no further antibiotics indicated 5. Continue Cetirizine 10mg  daily 6. Referral to Allergist - future consider allergy shots as option  Note for work 4/14 through Friday 4/17 - return on Saturday 4/18 - likely non contagious if allergic only  Meds ordered this encounter  Medications  . montelukast (SINGULAIR) 10 MG tablet    Sig: Take 1 tablet (10 mg total) by mouth at bedtime.    Dispense:  30 tablet    Refill:  3  . azelastine (ASTELIN) 0.1 % nasal spray    Sig: Place 1-2 sprays into both nostrils 2 (two) times  daily. Use in each nostril as directed    Dispense:  30 mL    Refill:  3  . fluticasone (FLONASE) 50 MCG/ACT nasal spray    Sig: Place 2 sprays into both nostrils daily. Use for 4-6 weeks then stop and use seasonally or as needed.    Dispense:  16 g    Refill:  5   Orders Placed This Encounter  Procedures  . Ambulatory referral to Allergy    Referral Priority:   Routine    Referral Type:   Allergy Testing    Referral Reason:   Specialty Services Required    Requested Specialty:   Allergy    Number of Visits Requested:   1    OPTIONAL RECOMMENDED self quarantine for patient safety for PREVENTION ONLY. It is not required based on current clinical symptoms. If they were to develop fever or worsening shortness of breath, then emphasis on REQUIRED quarantine for up to 7-14 days that could be resolved if fever free >3 days AND if symptoms improving after 7 days.   If symptoms do not resolve or significantly improve OR if WORSENING - fever / cough - or worsening shortness of breath - then should contact us and seek advice on next steps in treatment at home vs where/when to seek care at Urgent Care or Hospital ED for further intervention and possible testing if indicated.  Patient verbalizes understanding with the above medical recommendations including the limitation of remote medical advice.  Specific follow-up / call-back criteria were given for patient to follow-up or seek medical care more urgently if needed.   - Time spent in direct consultation with patient on phone: 12 minutes  Saralyn PilarAlexander Koen Antilla, DO [facility] 05/15/2018, 11:21 AM

## 2018-05-28 ENCOUNTER — Telehealth: Payer: Self-pay | Admitting: Family Medicine

## 2018-05-28 ENCOUNTER — Encounter: Payer: Self-pay | Admitting: Family Medicine

## 2018-05-28 NOTE — Telephone Encounter (Signed)
Pt  Called said that she return to work on the 18th and was told to go back  Home. Pt have been out of work since the 18th and requesting a letter to return on May 1st , said that she have a Dr appt. with Allergy Dr. Thursday. Pt  Will get letter in my chart

## 2018-05-28 NOTE — Telephone Encounter (Signed)
Reviewed chart, prior note and last visit.  She is scheduled to see [place] [facility] in April 2020.  She returned to work on 4/18 but was told to leave still due to symptoms, and she was excused until she was able to return, now new dates out 4/18 through 4/30 - return on May 1. Note written and sent to her mychart.  Saralyn Pilar, DO [facility] 05/28/2018, 11:34 AM

## 2018-06-10 ENCOUNTER — Emergency Department
Admission: EM | Admit: 2018-06-10 | Discharge: 2018-06-10 | Disposition: A | Discharge status: Home / Self Care | Payer: Self-pay | Attending: Emergency Medicine | Admitting: Emergency Medicine

## 2018-06-10 ENCOUNTER — Other Ambulatory Visit: Payer: Self-pay

## 2018-06-10 ENCOUNTER — Encounter: Payer: Self-pay | Admitting: Emergency Medicine

## 2018-06-10 ENCOUNTER — Emergency Department: Payer: Self-pay

## 2018-06-10 DIAGNOSIS — M545 Low back pain: Secondary | ICD-10-CM | POA: Insufficient documentation

## 2018-06-10 DIAGNOSIS — M5412 Radiculopathy, cervical region: Secondary | ICD-10-CM | POA: Insufficient documentation

## 2018-06-10 DIAGNOSIS — M25512 Pain in left shoulder: Secondary | ICD-10-CM | POA: Insufficient documentation

## 2018-06-10 LAB — POCT PREGNANCY, URINE: Preg Test, Ur: NEGATIVE

## 2018-06-10 MED ORDER — BACLOFEN 10 MG PO TABS: 10.0000 mg | ORAL_TABLET | Freq: Three times a day (TID) | ORAL | 1 refills | Status: DC

## 2018-06-10 MED ORDER — PREDNISONE 10 MG (21) PO TBPK: ORAL_TABLET | ORAL | 0 refills | Status: DC

## 2018-06-10 NOTE — ED Triage Notes (Signed)
Pt to ED via POV, pt states that she has been having lower back pain, neck pain, and left arm numbness and tingling. Pt states that this has been going on for about 3 days. Pt states that she has not injured herself that she knows of. Pt states that she does a lot of moving and lifting at work. Pt is in NAD at this time.

## 2018-06-10 NOTE — ED Provider Notes (Signed)
[facility] Emergency Department Provider Note  ____________________________________________   First MD Initiated Contact with Patient 06/10/18 0831     (approximate)  I have reviewed the triage vital signs and the nursing notes.   HISTORY  Chief Complaint Back Pain and Neck Pain    HPI Heidi Douglas is a 33 y.o. female presents emergency department complaining of neck and lower back pain.  Neck pain radiates into the left arm and she has a lot of numbness and tingling.  At that time she will drop objects from her hand.  She denies any known injury however she does work at [employer].  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  She states her boss saw her struggling yesterday and moved her to a couple of different positions to avoid the overuse but that she saw her bent over because of the pain and told her to follow-up with a doctor before returning to work.    Past Medical History:  Diagnosis Date  . Breathing problem     There are no active problems to display for this patient.   Past Surgical History:  Procedure Laterality Date  . KNEE SURGERY      Prior to Admission medications   Medication Sig Start Date End Date Taking? Authorizing Provider  acetaminophen (TYLENOL) 500 MG tablet Take 1,000 mg by mouth every 6 (six) hours as needed for moderate pain.    [provider]  albuterol (PROVENTIL HFA;VENTOLIN HFA) 108 (90 Base) MCG/ACT inhaler Inhale 2 puffs into the lungs every 6 (six) hours as needed for wheezing or shortness of breath.    [provider]  azelastine (ASTELIN) 0.1 % nasal spray Place 1-2 sprays into both nostrils 2 (two) times daily. Use in each nostril as directed 05/15/18   Karamalegos, Netta Neat, DO  baclofen (LIORESAL) 10 MG tablet Take 1 tablet (10 mg total) by mouth 3 (three) times daily.  06/10/18 06/10/19  Yaneisy Wenz, Roselyn Bering, PA-C  fluticasone (FLONASE) 50 MCG/ACT nasal spray Place 2 sprays into both nostrils daily. Use for 4-6 weeks then stop and use seasonally or as needed. 05/15/18   Karamalegos, Netta Neat, DO  montelukast (SINGULAIR) 10 MG tablet Take 1 tablet (10 mg total) by mouth at bedtime. 05/15/18   Karamalegos, Netta Neat, DO  predniSONE (STERAPRED UNI-PAK 21 TAB) 10 MG (21) TBPK tablet Take 6 pills on day one then decrease by 1 pill each day 06/10/18   Faythe Ghee, PA-C    Allergies Penicillins  Family History  Problem Relation Age of Onset  . Asthma Sister   . Asthma Daughter     Social History Social History   Tobacco Use  . Smoking status: Current Some Day Smoker  . Smokeless tobacco: Current User  Substance Use Topics  . Alcohol use: No    Frequency: Never  . Drug use: No    Review of Systems  Constitutional: No fever/chills Eyes: No visual changes. ENT: No sore throat. Respiratory: Denies cough Genitourinary: Negative for dysuria. Musculoskeletal: Positive for neck and back pain.  Radiculopathy to the left arm Skin: Negative for rash.    ____________________________________________   PHYSICAL EXAM:  VITAL SIGNS: ED Triage Vitals  Enc Vitals Group     BP 06/10/18 0815 133/79     Pulse Rate 06/10/18 0815 80  Resp --      Temp 06/10/18 0815 97.8 F (36.6 C)     Temp Source 06/10/18 0815 Oral     SpO2 06/10/18 0815 100 %     Weight 06/10/18 0812 180 lb (81.6 kg)     Height 06/10/18 0812  (1.626 m)     Head Circumference --      Peak Flow --      Pain Score 06/10/18 0811 8     Pain Loc --      Pain Edu? --      Excl. in [place]? --     Constitutional: Alert and oriented. Well appearing and in no acute distress. Eyes: Conjunctivae are normal.  Head: Atraumatic. Nose: No congestion/rhinnorhea. Mouth/Throat: Mucous membranes are moist.   Neck:  supple no lymphadenopathy noted Cardiovascular: Normal rate, regular rhythm.   Respiratory: Normal respiratory effort.  No retractions GU: deferred Musculoskeletal: FROM all extremities, warm and well perfused, grips are equal bilaterally, C-spine is minimally tender, left trapezius muscle spasm, radiculopathy reproduced with palpation of the left shoulder in the joint space. Neurologic:  Normal speech and language.  Skin:  Skin is warm, dry and intact. No rash noted. Psychiatric: Mood and affect are normal. Speech and behavior are normal.  ____________________________________________   LABS (all labs ordered are listed, but only abnormal results are displayed)  Labs Reviewed  POC URINE PREG, ED  POCT PREGNANCY, URINE   ____________________________________________   ____________________________________________  RADIOLOGY  C-spine x-ray is normal  ____________________________________________   PROCEDURES  Procedure(s) performed: No  Procedures    ____________________________________________   INITIAL IMPRESSION / ASSESSMENT AND PLAN / ED COURSE  Pertinent labs & imaging results that were available during my care of the patient were reviewed by me and considered in my medical decision making (see chart for details).   Patient is 33 year old female presents emergency department with low back pain and neck pain with radiculopathy left arm.  Physical exam shows mild tenderness of the C-spine along with a spasm in the left shoulder.  Grips are equal bilaterally.  C-spine x-ray    ----------------------------------------- 10:41 AM on 06/10/2018 -----------------------------------------  X-rays C-spine is normal.  Explained findings to the patient.  Explained to her this could be inflammation in the shoulder itself or cervical radiculopathy.  She was given a prescription for Sterapred and baclofen.  She is to follow-up with orthopedics if not improving in 5 to 7 days.  She was given a work note.  Work note also stated for her employer to check  her station with ergonomics.  She is to apply ice to the left side of the neck and the left shoulder.  Return emergency department if worsening.  She was discharged stable condition  As part of my medical decision making, I reviewed the following data within the electronic MEDICAL RECORD NUMBER Nursing notes reviewed and incorporated, Labs reviewed POC pregnant negative, Old chart reviewed, Radiograph reviewed C-spine x-ray negative, Notes from prior ED visits and [place] Controlled Substance Database  ____________________________________________   FINAL CLINICAL IMPRESSION(S) / ED DIAGNOSES  Final diagnoses:  Cervical radiculopathy  Acute pain of left shoulder      NEW MEDICATIONS STARTED DURING THIS VISIT:  New Prescriptions   BACLOFEN (LIORESAL) 10 MG TABLET    Take 1 tablet (10 mg total) by mouth 3 (three) times daily.   PREDNISONE (STERAPRED UNI-PAK 21 TAB) 10 MG (21) TBPK TABLET    Take 6 pills on day one then decrease by  1 pill each day     Note:  This document was prepared using Dragon voice recognition software and may include unintentional dictation errors.    Faythe GheeFisher, Daylan Boggess W, PA-C 06/10/18 1042    Sharyn CreamerQuale, Mark, MD 06/10/18 [phone]

## 2018-06-10 NOTE — Discharge Instructions (Addendum)
Follow-up with orthopedics if you are not better in 3 to 5 days.  Return emergency department worsening.  Apply ice to the left shoulder and left side of the neck.  Take the medications as prescribed.  If the shoulder pain and neck pain continue you will need to be evaluated further by orthopedics.

## 2019-04-04 ENCOUNTER — Other Ambulatory Visit: Payer: Self-pay

## 2019-04-04 ENCOUNTER — Emergency Department
Admission: EM | Admit: 2019-04-04 | Discharge: 2019-04-05 | Disposition: A | Discharge status: Home / Self Care | Payer: Self-pay | Attending: Emergency Medicine | Admitting: Emergency Medicine

## 2019-04-04 ENCOUNTER — Emergency Department: Payer: Self-pay

## 2019-04-04 DIAGNOSIS — F17200 Nicotine dependence, unspecified, uncomplicated: Secondary | ICD-10-CM | POA: Insufficient documentation

## 2019-04-04 DIAGNOSIS — Z79899 Other long term (current) drug therapy: Secondary | ICD-10-CM | POA: Insufficient documentation

## 2019-04-04 DIAGNOSIS — O2311 Infections of bladder in pregnancy, first trimester: Secondary | ICD-10-CM | POA: Insufficient documentation

## 2019-04-04 DIAGNOSIS — O26899 Other specified pregnancy related conditions, unspecified trimester: Secondary | ICD-10-CM

## 2019-04-04 DIAGNOSIS — B9689 Other specified bacterial agents as the cause of diseases classified elsewhere: Secondary | ICD-10-CM | POA: Insufficient documentation

## 2019-04-04 DIAGNOSIS — O99331 Smoking (tobacco) complicating pregnancy, first trimester: Secondary | ICD-10-CM | POA: Insufficient documentation

## 2019-04-04 DIAGNOSIS — O219 Vomiting of pregnancy, unspecified: Secondary | ICD-10-CM | POA: Insufficient documentation

## 2019-04-04 DIAGNOSIS — Z3A01 Less than 8 weeks gestation of pregnancy: Secondary | ICD-10-CM | POA: Insufficient documentation

## 2019-04-04 DIAGNOSIS — O26891 Other specified pregnancy related conditions, first trimester: Secondary | ICD-10-CM

## 2019-04-04 DIAGNOSIS — N3000 Acute cystitis without hematuria: Secondary | ICD-10-CM

## 2019-04-04 LAB — COMPREHENSIVE METABOLIC PANEL
ALT: 22 U/L (ref 0–44)
AST: 15 U/L (ref 15–41)
Albumin: 3.9 g/dL (ref 3.5–5.0)
Alkaline Phosphatase: 49 U/L (ref 38–126)
Anion gap: 8 (ref 5–15)
BUN: 12 mg/dL (ref 6–20)
CO2: 23 mmol/L (ref 22–32)
Calcium: 9.3 mg/dL (ref 8.9–10.3)
Chloride: 104 mmol/L (ref 98–111)
Creatinine, Ser: 0.74 mg/dL (ref 0.44–1.00)
GFR calc Af Amer: >60 mL/min (ref >60)
GFR calc non Af Amer: >60 mL/min (ref >60)
Glucose, Bld: 86 mg/dL (ref 70–99)
Potassium: 3.5 mmol/L (ref 3.5–5.1)
Sodium: 135 mmol/L (ref 135–145)
Total Bilirubin: 0.5 mg/dL (ref 0.3–1.2)
Total Protein: 8.2 g/dL — ABNORMAL HIGH (ref 6.5–8.1)

## 2019-04-04 LAB — WET PREP, GENITAL
Sperm: NONE SEEN
Trich, Wet Prep: NONE SEEN
Yeast Wet Prep HPF POC: NONE SEEN

## 2019-04-04 LAB — URINALYSIS, COMPLETE (UACMP) WITH MICROSCOPIC
Bacteria, UA: NONE SEEN
Bacteria, UA: NONE SEEN
Bilirubin Urine: NEGATIVE
Bilirubin Urine: NEGATIVE
Glucose, UA: NEGATIVE mg/dL
Glucose, UA: NEGATIVE mg/dL
Hgb urine dipstick: NEGATIVE
Ketones, ur: 20 mg/dL — AB
Ketones, ur: 80 mg/dL — AB
Nitrite: NEGATIVE
Nitrite: NEGATIVE
Protein, ur: 100 mg/dL — AB
Protein, ur: 30 mg/dL — AB
RBC / HPF: >50 RBC/hpf — ABNORMAL HIGH (ref 0–5)
Specific Gravity, Urine: 1.033 — ABNORMAL HIGH (ref 1.005–1.030)
Specific Gravity, Urine: 1.032 — ABNORMAL HIGH (ref 1.005–1.030)
Squamous Epithelial / HPF: >50 — ABNORMAL HIGH (ref 0–5)
WBC, UA: >50 WBC/hpf — ABNORMAL HIGH (ref 0–5)
pH: 5 (ref 5.0–8.0)
pH: 5 (ref 5.0–8.0)

## 2019-04-04 LAB — CHLAMYDIA/NGC RT PCR (ARMC ONLY)
Chlamydia Tr: NOT DETECTED
N gonorrhoeae: NOT DETECTED

## 2019-04-04 LAB — CBC
HCT: 37.7 % (ref 36.0–46.0)
Hemoglobin: 13 g/dL (ref 12.0–15.0)
MCH: 32.6 pg (ref 26.0–34.0)
MCHC: 34.5 g/dL (ref 30.0–36.0)
MCV: 94.5 fL (ref 80.0–100.0)
Platelets: 313 10*3/uL (ref 150–400)
RBC: 3.99 MIL/uL (ref 3.87–5.11)
RDW: 11.8 % (ref 11.5–15.5)
WBC: 7.7 10*3/uL (ref 4.0–10.5)
nRBC: 0 % (ref 0.0–0.2)

## 2019-04-04 LAB — HCG, QUANTITATIVE, PREGNANCY: hCG, Beta Chain, Quant, S: 45156 m[IU]/mL — ABNORMAL HIGH (ref <5)

## 2019-04-04 LAB — LIPASE, BLOOD: Lipase: 23 U/L (ref 11–51)

## 2019-04-04 LAB — PREGNANCY, URINE: Preg Test, Ur: POSITIVE — AB

## 2019-04-04 MED ORDER — CEPHALEXIN 500 MG PO CAPS: 500.0000 mg | ORAL_CAPSULE | Freq: Two times a day (BID) | ORAL | 0 refills | Status: AC

## 2019-04-04 MED ORDER — ONDANSETRON 4 MG PO TBDP
4.0000 mg | ORAL_TABLET | Freq: Once | ORAL | Status: AC
  Administered 2019-04-04: 4 mg via ORAL
  Filled 2019-04-04: qty 1

## 2019-04-04 MED ORDER — CEPHALEXIN 500 MG PO CAPS
500.0000 mg | ORAL_CAPSULE | Freq: Once | ORAL | Status: AC
  Administered 2019-04-04: 500 mg via ORAL
  Filled 2019-04-04: qty 1

## 2019-04-04 MED ORDER — ONDANSETRON HCL 4 MG/2ML IJ SOLN
4.0000 mg | Freq: Once | INTRAMUSCULAR | Status: DC
  Filled 2019-04-04: qty 2

## 2019-04-04 MED ORDER — ONDANSETRON HCL 4 MG/2ML IJ SOLN
4.0000 mg | Freq: Once | INTRAMUSCULAR | Status: AC
  Administered 2019-04-04: 4 mg via INTRAVENOUS
  Filled 2019-04-04: qty 2

## 2019-04-04 MED ORDER — ONDANSETRON 4 MG PO TBDP: 4.0000 mg | ORAL_TABLET | Freq: Three times a day (TID) | ORAL | 0 refills | Status: DC | PRN

## 2019-04-04 MED ORDER — LACTATED RINGERS IV BOLUS: 1000.0000 mL | Freq: Once | INTRAVENOUS | Status: DC

## 2019-04-04 NOTE — ED Notes (Signed)
Pt presented with N/V x2weeks, reports worse today than before. Pt is ambulatory in the room, states she has vomited x3 since being here. Pt repositioned in bed and placed on O2 and BP monitor.  EDP bedside

## 2019-04-04 NOTE — ED Triage Notes (Signed)
Pt comes POV c/o abdominal pain, n/v/d. Can't keep food down, pt reports being weak from vomiting. Started last week. LMP Jan 18th.

## 2019-04-04 NOTE — ED Provider Notes (Signed)
[facility] Emergency Department Provider Note   ____________________________________________   First MD Initiated Contact with Patient 04/04/19 1851     (approximate)  I have reviewed the triage vital signs and the nursing notes.   HISTORY  Chief Complaint Abdominal Pain    HPI Heidi Douglas is a 34 y.o. female with no significant past medical history who presents to the ED complaining of abdominal pain and vomiting.  Patient reports that she has had persistent nausea and vomiting for the past 2 weeks, often times has been unable to keep down either solids or liquids.  After the onset of vomiting, she has had diffuse abdominal cramping, but she denies any diarrhea.  She noticed some vaginal spotting last week, but this is since resolved and she denies any dysuria, hematuria, or vaginal discharge.  She has not had any fevers denies any cough, chest pain, shortness of breath, or sick contacts.  She is concerned that she might be pregnant given her last period was on January 18.  This would be her sixth pregnancy, with 3 prior miscarriages and 2 healthy daughters.        Past Medical History:  Diagnosis Date  . Breathing problem     There are no problems to display for this patient.   Past Surgical History:  Procedure Laterality Date  . KNEE SURGERY      Prior to Admission medications   Medication Sig Start Date End Date Taking? Authorizing Provider  acetaminophen (TYLENOL) 500 MG tablet Take 1,000 mg by mouth every 6 (six) hours as needed for moderate pain.    [provider]  albuterol (PROVENTIL HFA;VENTOLIN HFA) 108 (90 Base) MCG/ACT inhaler Inhale 2 puffs into the lungs every 6 (six) hours as needed for wheezing or shortness of breath.    [provider]  azelastine (ASTELIN) 0.1 % nasal spray Place 1-2 sprays into both nostrils 2 (two) times daily. Use in each nostril as directed 05/15/18   Karamalegos, Netta Neat, DO    baclofen (LIORESAL) 10 MG tablet Take 1 tablet (10 mg total) by mouth 3 (three) times daily. 06/10/18 06/10/19  Fisher, Roselyn Bering, PA-C  cephALEXin (KEFLEX) 500 MG capsule Take 1 capsule (500 mg total) by mouth 2 (two) times daily for 5 days. 04/04/19 04/09/19  Chesley Noon, MD  fluticasone (FLONASE) 50 MCG/ACT nasal spray Place 2 sprays into both nostrils daily. Use for 4-6 weeks then stop and use seasonally or as needed. 05/15/18   Karamalegos, Netta Neat, DO  montelukast (SINGULAIR) 10 MG tablet Take 1 tablet (10 mg total) by mouth at bedtime. 05/15/18   Karamalegos, Netta Neat, DO  ondansetron (ZOFRAN ODT) 4 MG disintegrating tablet Take 1 tablet (4 mg total) by mouth every 8 (eight) hours as needed for nausea or vomiting. 04/04/19   Chesley Noon, MD  predniSONE (STERAPRED UNI-PAK 21 TAB) 10 MG (21) TBPK tablet Take 6 pills on day one then decrease by 1 pill each day 06/10/18   Faythe Ghee, PA-C    Allergies Penicillins  Family History  Problem Relation Age of Onset  . Asthma Sister   . Asthma Daughter     Social History Social History   Tobacco Use  . Smoking status: Current Some Day Smoker  . Smokeless tobacco: Current User  Substance Use Topics  . Alcohol use: No  . Drug use: No    Review of Systems  Constitutional: No fever/chills Eyes: No visual changes. ENT: No sore throat.  Cardiovascular: Denies chest pain. Respiratory: Denies shortness of breath. Gastrointestinal: Positive for abdominal pain.  Positive for nausea and vomiting.  No diarrhea.  No constipation. Genitourinary: Negative for dysuria. Musculoskeletal: Negative for back pain. Skin: Negative for rash. Neurological: Negative for headaches, focal weakness or numbness.  ____________________________________________   PHYSICAL EXAM:  VITAL SIGNS: ED Triage Vitals  Enc Vitals Group     BP 04/04/19 1604 126/70     Pulse Rate 04/04/19 1604 84     Resp 04/04/19 1604 18     Temp 04/04/19 1604 98.7 F  (37.1 C)     Temp Source 04/04/19 1604 Oral     SpO2 04/04/19 1604 100 %     Weight 04/04/19 1605 170 lb (77.1 kg)     Height 04/04/19 1605 5\' 8"  (1.727 m)     Head Circumference --      Peak Flow --      Pain Score 04/04/19 1605 8     Pain Loc --      Pain Edu? --      Excl. in [place]? --     Constitutional: Alert and oriented. Eyes: Conjunctivae are normal. Head: Atraumatic. Nose: No congestion/rhinnorhea. Mouth/Throat: Mucous membranes are moist. Neck: Normal ROM Cardiovascular: Normal rate, regular rhythm. Grossly normal heart sounds. Respiratory: Normal respiratory effort.  No retractions. Lungs CTAB. Gastrointestinal: Soft and nontender. No distention. Genitourinary: Cervical os closed with thin whitish discharge, no cervical motion or adnexal tenderness. Musculoskeletal: No lower extremity tenderness nor edema. Neurologic:  Normal speech and language. No gross focal neurologic deficits are appreciated. Skin:  Skin is warm, dry and intact. No rash noted. Psychiatric: Mood and affect are normal. Speech and behavior are normal.  ____________________________________________   LABS (all labs ordered are listed, but only abnormal results are displayed)  Labs Reviewed  WET PREP, GENITAL - Abnormal; Notable for the following components:      Result Value   Clue Cells Wet Prep HPF POC PRESENT (*)    WBC, Wet Prep HPF POC FEW (*)    All other components within normal limits  COMPREHENSIVE METABOLIC PANEL - Abnormal; Notable for the following components:   Total Protein 8.2 (*)    All other components within normal limits  URINALYSIS, COMPLETE (UACMP) WITH MICROSCOPIC - Abnormal; Notable for the following components:   Color, Urine AMBER (*)    APPearance CLOUDY (*)    Specific Gravity, Urine 1.033 (*)    Hgb urine dipstick MODERATE (*)    Ketones, ur 20 (*)    Protein, ur 100 (*)    Leukocytes,Ua LARGE (*)    RBC / HPF >50 (*)    WBC, UA >50 (*)    Squamous Epithelial /  LPF >50 (*)    All other components within normal limits  PREGNANCY, URINE - Abnormal; Notable for the following components:   Preg Test, Ur POSITIVE (*)    All other components within normal limits  URINALYSIS, COMPLETE (UACMP) WITH MICROSCOPIC - Abnormal; Notable for the following components:   Color, Urine AMBER (*)    APPearance HAZY (*)    Specific Gravity, Urine 1.032 (*)    Ketones, ur 80 (*)    Protein, ur 30 (*)    Leukocytes,Ua SMALL (*)    All other components within normal limits  HCG, QUANTITATIVE, PREGNANCY - Abnormal; Notable for the following components:   hCG, Beta Chain, Quant, S 45,156 (*)    All other components within normal limits  CHLAMYDIA/NGC  RT PCR ([facility] ONLY)  URINE CULTURE  LIPASE, BLOOD  CBC  POC URINE PREG, ED   ____________________________________________  EKG  ED ECG REPORT I, Chesley Noon, the attending physician, personally viewed and interpreted this ECG.   Date: 04/04/2019  EKG Time: 16:07  Rate: 79  Rhythm: normal sinus rhythm  Axis: Normal  Intervals:none  ST&T Change: Nonspecific T wave changes   PROCEDURES  Procedure(s) performed (including Critical Care):  Procedures   ____________________________________________   INITIAL IMPRESSION / ASSESSMENT AND PLAN / ED COURSE       34 year old female presents to the ED with 2 weeks of persistent nausea and vomiting as well as diffuse crampy abdominal pain.  She has a benign abdominal exam here and lab work is thus far unremarkable.  Pregnancy testing is pending, but initial UA is dirty and we will need to recollect.  We will treat with fluid bolus and IV Zofran.  Pregnancy testing is positive, UA concerning for UTI.  Obstetrical ultrasound shows intrauterine pregnancy at approximately 7 weeks, also shows fluid collection in endometrial canal, likely representing blood products.  Patient without any significant bleeding or pain at this time and there is no mass-effect  from fluid collection on gestation.  She was advised of these results and counseled to follow-up with OB/GYN, otherwise return to the ED for new or worsening symptoms.  She was given initial dose of Keflex here in the ED and tolerated this without any apparent allergic reaction, we will prescribe Keflex for UTI.      ____________________________________________   FINAL CLINICAL IMPRESSION(S) / ED DIAGNOSES  Final diagnoses:  Nausea and vomiting in pregnancy  Abdominal pain during pregnancy in first trimester  Acute cystitis without hematuria     ED Discharge Orders         Ordered    cephALEXin (KEFLEX) 500 MG capsule  2 times daily     04/04/19 2342    ondansetron (ZOFRAN ODT) 4 MG disintegrating tablet  Every 8 hours PRN     04/04/19 2342           Note:  This document was prepared using Dragon voice recognition software and may include unintentional dictation errors.   Chesley Noon, MD 04/04/19 [phone]

## 2019-04-04 NOTE — ED Notes (Signed)
2 unsuccessful IV attempts by this RN (left Ac and left hand). Lupe Carney, RN, made aware at this time and this RN placing orders for IV team consult/difficult IV start.

## 2019-04-05 LAB — URINE CULTURE

## 2019-05-17 ENCOUNTER — Ambulatory Visit: Payer: Self-pay | Admitting: Family Medicine

## 2019-05-17 ENCOUNTER — Other Ambulatory Visit: Payer: Self-pay

## 2019-05-17 VITALS — BP 117/79 | Ht 64.0 in | Wt 190.8 lb

## 2019-05-17 DIAGNOSIS — J45909 Unspecified asthma, uncomplicated: Secondary | ICD-10-CM | POA: Insufficient documentation

## 2019-05-17 DIAGNOSIS — Z3009 Encounter for other general counseling and advice on contraception: Secondary | ICD-10-CM

## 2019-05-17 DIAGNOSIS — O039 Complete or unspecified spontaneous abortion without complication: Secondary | ICD-10-CM

## 2019-05-17 NOTE — Progress Notes (Signed)
Here today for FP services. Is interested in an IUD for birth control. Recently   relocated here from [place]. Is unsure of last PE or Pap Smear. Declines STD screening today. Tawny Hopping, RN

## 2019-05-17 NOTE — Progress Notes (Signed)
Family Planning Visit  Subjective:  Heidi Douglas is a 34 y.o. being seen today for  Chief Complaint  Patient presents with  . Contraception    Pt has Asthma and Miscarriage on their problem list.  HPI  Patient reports she is here to discuss BCM. She has used depo and mirena in the past. Interested in [place] IUD again.   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 Seen in ER 04/04/19, beta hCG 45,156 at that time, has not had repeat since then.   Pt denies any of the following known contraindications to LNG containing IUDs:  -Known distortion of uterine cavity -Acute pelvic infection -Known or suspected pregnancy -Unexplained abnormal uterine bleeding -Hormone sensitive cancer or breast cancer -Hepatic tumors or active hepatic disease -Uterine or cervical malignancy -Infected abortion <3 mo prior    Patient's last menstrual period was 04/13/2019 (exact date). Last sex: Jan 28 BCM: none Pt desires EC? n/a  Last pap: Nov, 2020 in [place]  Last breast exam: in [place]  Patient reports 1 partner(s) in last year. Do they desire STI screening (if no, why not)? No, declines. Recent GC/CT from ER 3/4 was negative.   Does the patient desire a pregnancy in the next year? No    34 y.o., Body mass index is 32.75 kg/m. - Is patient eligible for HA1C diabetes screening based on BMI and age >3?  no  Does the patient have a current or past history of drug use? no No components found for: HCV  See flowsheet for other program required questions.   Health Maintenance Due  Topic Date Due  . HIV Screening  Never done  . TETANUS/TDAP  Never done  . PAP SMEAR-Modifier  Never done    ROS  The following portions of the patient's history were reviewed and updated as appropriate: allergies, current medications, past family history, past medical history, past social history, past surgical history and problem list. Problem list  updated.  Objective:  BP 117/79   Ht 5\' 4"  (1.626 m)   Wt 190 lb 12.8 oz (86.5 kg)   LMP 04/13/2019 (Exact Date)   BMI 32.75 kg/m    Physical Exam  Gen: well appearing, NAD HEENT: no scleral icterus Lung: Normal WOB Ext: well perfused, no edema    Assessment and Plan:  Heidi Douglas is a 34 y.o. female presenting to the [facility] Department for a well woman exam/family planning visit  Contraception counseling: Reviewed all forms of birth control options in the tiered based approach including abstinence; over the counter/barrier methods; hormonal contraceptive medication including pill, patch, ring, injection, contraceptive implant; hormonal and nonhormonal IUDs; permanent sterilization options including vasectomy and the various tubal sterilization modalities. Risks, benefits, how to discontinue and typical effectiveness rates were reviewed.  Questions were answered.  Written information was also given to the patient to review.  Patient desires mirena IUD, appt was made for placement next week.  She was told to call with any further questions, or with any concerns about this method of contraception.  Emphasized use of condoms 100% of the time for STI prevention.  Emergency Contraception: n/a   1. Family planning services -Pt elects IUD placement, appt scheduled for next week. Last sex was January, advised continued abstinence or condom use until IUD appt.  -Pt to sign for pap records release.  2. Miscarriage -Recent miscarriage, stopped bleeding 2 wks ago. Will check beta hCG today to  be sure returned to 0 prior to IUD placement.  -Offered counseling, pt accepts. Referred to [facility] beh [facility] today. 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  - Ambulatory referral to Behavioral Health - Beta hCG quant (ref  lab)     Return in about 1 week (around 05/24/2019) for IUD insertion.  Future Appointments  Date Time Provider Department Center  05/24/2019  8:20 AM [facility] PROVIDER [facility] None    Ann Held, PA-C

## 2019-05-17 NOTE — Progress Notes (Addendum)
IUD consult completed. IUD Insertion appt scheduled for 05/24/2019 @ 8:20. Instructed patient to arrive at 8:00 for check in. Instructed to continue abstinence from sex until after IUD placement. Patient verbalized understanding. Per provider orders, Bhcg lab today. Tawny Hopping, RN

## 2019-05-18 LAB — BETA HCG QUANT (REF LAB): hCG Quant: 2 m[IU]/mL

## 2019-05-24 ENCOUNTER — Ambulatory Visit: Payer: Self-pay

## 2019-05-31 ENCOUNTER — Ambulatory Visit: Payer: Self-pay

## 2019-06-06 ENCOUNTER — Ambulatory Visit: Payer: Self-pay | Admitting: Licensed Clinical Social Worker

## 2019-09-27 ENCOUNTER — Encounter: Payer: Self-pay | Admitting: Family Medicine

## 2019-09-27 ENCOUNTER — Other Ambulatory Visit: Payer: Self-pay

## 2019-09-27 ENCOUNTER — Telehealth (INDEPENDENT_AMBULATORY_CARE_PROVIDER_SITE_OTHER): Payer: HRSA Program | Admitting: Family Medicine

## 2019-09-27 DIAGNOSIS — J3089 Other allergic rhinitis: Secondary | ICD-10-CM | POA: Diagnosis not present

## 2019-09-27 DIAGNOSIS — U071 COVID-19: Secondary | ICD-10-CM

## 2019-09-27 MED ORDER — ALBUTEROL SULFATE HFA 108 (90 BASE) MCG/ACT IN AERS: 2.0000 | INHALATION_SPRAY | Freq: Four times a day (QID) | RESPIRATORY_TRACT | 1 refills | Status: AC | PRN

## 2019-09-27 MED ORDER — FLUTICASONE PROPIONATE 50 MCG/ACT NA SUSP: 2.0000 | Freq: Every day | NASAL | 5 refills | Status: AC

## 2019-09-27 MED ORDER — GUAIFENESIN ER 600 MG PO TB12: 1200.0000 mg | ORAL_TABLET | Freq: Two times a day (BID) | ORAL | 0 refills | Status: AC

## 2019-09-27 MED ORDER — PROMETHAZINE-DM 6.25-15 MG/5ML PO SYRP: 5.0000 mL | ORAL_SOLUTION | Freq: Four times a day (QID) | ORAL | 0 refills | Status: AC | PRN

## 2019-09-27 MED ORDER — CETIRIZINE HCL 10 MG PO TABS: 10.0000 mg | ORAL_TABLET | Freq: Every day | ORAL | 0 refills | Status: DC

## 2019-09-27 NOTE — Assessment & Plan Note (Signed)
+   COVID - 19 diagnosis on 09/25/2019 with 6 days of nasal congestion, chest congestion, postnasal drainage, night time cough that is productive, body aches, chills and subjective fever.  Encouraged to discontinue mucinex fastmax and will send in prescription for guaifenesin 1200mg  BID, flonase daily, cetirizine 10mg  daily, albuterol inhaler to use 1-2 puffs every 4-6 hours, and promethazine DM to use 4x per day as needed for cough.  Plan: 1. BEGIN Guaifenesin 1200mg  BID for the next 10 days 2. BEGIN Cetirizine 10mg  daily, flonase daily, albuterol inhaler 1-2 puffs every 4-6 hours as needed for cough, SOB or wheezing 3. BEGIN promethazine DM 92ml every 6 hours as needed for cough. 4. Strict ER precautions reviewed 5. If having fever on Monday-Tuesday, to contact and will send in antibiotics 6. RTC PRN

## 2019-09-27 NOTE — Patient Instructions (Signed)
I have sent in a few prescriptions to help with your symptoms.  Begin guaifenesin 1200mg  twice per day for the next 10 days Begin cetirizine 10mg  twice per day for the next 10 days Take promethazine DM 38ml every 6 hours as needed for cough.  Be sure not to drive or operate heavy machinery while taking this medication as it can be sedating Can take 1-2 puffs of albuterol inhaler every 4-6 hours as needed for cough or shortness of breath  Increase your fluids  Can take over the counter elderberry, zinc and vitamin C to help with immune support  If you begin to have worsening shortness of breath, chest pain, fever over 104 that is not responsive to ibuprofen and/or acetaminophen, or impending sense of doom to PROCEED TO THE EMERGENCY ROOM IMMEDIATELY!  We will plan to see you back if your symptoms worsen or fail to improve  You will receive a survey after today's visit either digitally by e-mail or paper by USPS mail. Your experiences and feedback matter to .  Please respond so we know how we are doing as we provide care for you.  Call 4m with any questions/concerns/needs.  It is my goal to be available to you for your health concerns.  Thanks for choosing me to be a partner in your healthcare needs!  [place], FNP-C Family Nurse Practitioner [facility] Phone: [phone]

## 2019-09-27 NOTE — Progress Notes (Signed)
Virtual Visit via Telephone  The purpose of this virtual visit is to provide medical care while limiting exposure to the novel coronavirus (COVID19) for both patient and office staff.  Consent was obtained for phone visit:  Yes.   Answered questions that patient had about telehealth interaction:  Yes.   I discussed the limitations, risks, security and privacy concerns of performing an evaluation and management service by telephone. I also discussed with the patient that there may be a patient responsible charge related to this service. The patient expressed understanding and agreed to proceed.  Patient is at home and is accessed via telephone Services are provided by Charlaine Dalton, FNP-C from [facility])  ---------------------------------------------------------------------- Chief Complaint  Patient presents with  . COVID    nasal congestion, chest congestion, post nasal drainage, coughing mostly at bedtime, productive coughing bodyaches, chills and possible fever x 6 days. Pt had a positive COVID test on Wednesday.     S: Reviewed CMA documentation. I have called patient and gathered additional HPI as follows:  Heidi Douglas presents to clinic via telemedicine visit for concerns of nasal congestion, chest congestion, postnasal drainage, night time cough that is productive, body aches, chills and fever x 6 days.  Diagnosed with COVID on 09/25/2019.  Has been taking mucinex fast max with some relief of symptoms.  Denies sore throat, change in taste/smell, SOB, CP, abdominal pain, n/v/d  Patient is currently home in isolation Denies any high risk travel to areas of current concern for COVID19. Denies any known or suspected exposure to person with or possibly with COVID19.  Past Medical History:  Diagnosis Date  . Breathing problem    Social History   Tobacco Use  . Smoking status: Former Smoker    Types: Cigarettes  . Smokeless tobacco: Former Geophysicist/field seismologist  . Vaping Use: Never used  Substance Use Topics  . Alcohol use: No  . Drug use: No    Current Outpatient Medications:  .  acetaminophen (TYLENOL) 500 MG tablet, Take 1,000 mg by mouth every 6 (six) hours as needed for moderate pain., Disp: , Rfl:  .  fluticasone (FLONASE) 50 MCG/ACT nasal spray, Place 2 sprays into both nostrils daily. Use for 4-6 weeks then stop and use seasonally or as needed., Disp: 16 g, Rfl: 5 .  montelukast (SINGULAIR) 10 MG tablet, Take 1 tablet (10 mg total) by mouth at bedtime., Disp: 30 tablet, Rfl: 3 .  albuterol (VENTOLIN HFA) 108 (90 Base) MCG/ACT inhaler, Inhale 2 puffs into the lungs every 6 (six) hours as needed for wheezing or shortness of breath., Disp: 6.7 g, Rfl: 1 .  cetirizine (ZYRTEC) 10 MG tablet, Take 1 tablet (10 mg total) by mouth daily., Disp: 30 tablet, Rfl: 0 .  guaiFENesin (MUCINEX) 600 MG 12 hr tablet, Take 2 tablets (1,200 mg total) by mouth 2 (two) times daily for 10 days., Disp: 40 tablet, Rfl: 0 .  promethazine-dextromethorphan (PROMETHAZINE-DM) 6.25-15 MG/5ML syrup, Take 5 mLs by mouth 4 (four) times daily as needed for cough., Disp: 118 mL, Rfl: 0  Depression screen [facility] 2/9 05/15/2018 05/01/2018  Decreased Interest 0 0  Down, Depressed, Hopeless 0 0  PHQ - 2 Score 0 0    No flowsheet data found.  -------------------------------------------------------------------------- O: No physical exam performed due to remote telephone encounter.  Physical Exam: Patient remotely monitored without video.  Verbal communication appropriate.  Cognition normal.  No results found for this or any previous visit (  from the past 2160 hour(s)).  -------------------------------------------------------------------------- A&P:  Problem List Items Addressed This Visit      Other   COVID-19 - Primary    + COVID - 19 diagnosis on 09/25/2019 with 6 days of nasal congestion, chest congestion, postnasal drainage, night time cough that is productive, body  aches, chills and subjective fever.  Encouraged to discontinue mucinex fastmax and will send in prescription for guaifenesin 1200mg  BID, flonase daily, cetirizine 10mg  daily, albuterol inhaler to use 1-2 puffs every 4-6 hours, and promethazine DM to use 4x per day as needed for cough.  Plan: 1. BEGIN Guaifenesin 1200mg  BID for the next 10 days 2. BEGIN Cetirizine 10mg  daily, flonase daily, albuterol inhaler 1-2 puffs every 4-6 hours as needed for cough, SOB or wheezing 3. BEGIN promethazine DM 18ml every 6 hours as needed for cough. 4. Strict ER precautions reviewed 5. If having fever on Monday-Tuesday, to contact and will send in antibiotics 6. RTC PRN      Relevant Medications   guaiFENesin (MUCINEX) 600 MG 12 hr tablet   promethazine-dextromethorphan (PROMETHAZINE-DM) 6.25-15 MG/5ML syrup   albuterol (VENTOLIN HFA) 108 (90 Base) MCG/ACT inhaler   fluticasone (FLONASE) 50 MCG/ACT nasal spray   cetirizine (ZYRTEC) 10 MG tablet    Other Visit Diagnoses    Environmental and seasonal allergies       Relevant Medications   fluticasone (FLONASE) 50 MCG/ACT nasal spray   Seasonal allergic rhinitis due to other allergic trigger       Relevant Medications   fluticasone (FLONASE) 50 MCG/ACT nasal spray      Meds ordered this encounter  Medications  . guaiFENesin (MUCINEX) 600 MG 12 hr tablet    Sig: Take 2 tablets (1,200 mg total) by mouth 2 (two) times daily for 10 days.    Dispense:  40 tablet    Refill:  0  . promethazine-dextromethorphan (PROMETHAZINE-DM) 6.25-15 MG/5ML syrup    Sig: Take 5 mLs by mouth 4 (four) times daily as needed for cough.    Dispense:  118 mL    Refill:  0  . albuterol (VENTOLIN HFA) 108 (90 Base) MCG/ACT inhaler    Sig: Inhale 2 puffs into the lungs every 6 (six) hours as needed for wheezing or shortness of breath.    Dispense:  6.7 g    Refill:  1  . fluticasone (FLONASE) 50 MCG/ACT nasal spray    Sig: Place 2 sprays into both nostrils daily. Use for 4-6  weeks then stop and use seasonally or as needed.    Dispense:  16 g    Refill:  5  . cetirizine (ZYRTEC) 10 MG tablet    Sig: Take 1 tablet (10 mg total) by mouth daily.    Dispense:  30 tablet    Refill:  0    Follow-up: - Return if symptoms worsen or fail to improve - Strict ER precautions provided  Patient verbalizes understanding with the above medical recommendations including the limitation of remote medical advice.  Specific follow-up and call-back criteria were given for patient to follow-up or seek medical care more urgently if needed.  - Time spent in direct consultation with patient on phone: 9 minutes  4m, FNP-C [facility] 09/27/2019, 2:10 PM

## 2019-09-29 ENCOUNTER — Encounter: Payer: Self-pay | Admitting: Family Medicine

## 2019-09-30 ENCOUNTER — Other Ambulatory Visit: Payer: Self-pay | Admitting: Family Medicine

## 2019-09-30 DIAGNOSIS — U071 COVID-19: Secondary | ICD-10-CM

## 2019-09-30 MED ORDER — AZITHROMYCIN 250 MG PO TABS: ORAL_TABLET | ORAL | 0 refills | Status: AC

## 2019-10-20 ENCOUNTER — Other Ambulatory Visit: Payer: Self-pay | Admitting: Family Medicine

## 2019-10-20 DIAGNOSIS — U071 COVID-19: Secondary | ICD-10-CM

## 2019-10-20 NOTE — Telephone Encounter (Signed)
Requested Prescriptions  Pending Prescriptions Disp Refills  . cetirizine (ZYRTEC) 10 MG tablet [Pharmacy Med Name: CETIRIZINE HCL 10 MG TABLET] 90 tablet 3    Sig: TAKE 1 TABLET BY MOUTH EVERY DAY     Ear, Nose, and Throat:  Antihistamines Passed - 10/20/2019 12:52 AM      Passed - Valid encounter within last 12 months    Recent Outpatient Visits          3 weeks ago COVID-19   [facility], Jodelle Gross, FNP   1 year ago Environmental and seasonal allergies   [facility] Smitty Cords, DO   1 year ago Acute non-recurrent maxillary sinusitis   [facility] [place], Netta Neat, DO

## 2020-09-13 ENCOUNTER — Ambulatory Visit
Admission: EM | Admit: 2020-09-13 | Discharge: 2020-09-13 | Disposition: A | Discharge status: Home / Self Care | Payer: Self-pay | Attending: Emergency Medicine | Admitting: Emergency Medicine

## 2020-09-13 ENCOUNTER — Encounter: Payer: Self-pay | Admitting: Emergency Medicine

## 2020-09-13 DIAGNOSIS — H6122 Impacted cerumen, left ear: Secondary | ICD-10-CM

## 2020-09-13 DIAGNOSIS — J069 Acute upper respiratory infection, unspecified: Secondary | ICD-10-CM

## 2020-09-13 NOTE — ED Triage Notes (Signed)
Pt here with nasal congestion, bilateral otalgia, facial pressure and headache with intermittent sore throat x 3 days.

## 2020-09-13 NOTE — ED Provider Notes (Signed)
Heidi Douglas    CSN: [id number] Arrival date & time: 09/13/20  1203      History   Chief Complaint Chief Complaint  Patient presents with   Nasal Congestion   Otalgia   Facial Pain   Headache    HPI Heidi Douglas is a 35 y.o. female.  Patient presents with 3-day history of ear pain, sore throat, nasal congestion, sinus pressure, headache, nonproductive cough.  She denies fever, chills, rash, shortness of breath, wheezing, vomiting, diarrhea, or other symptoms.  OTC treatment attempted at home.  Her medical history includes asthma.  The history is provided by the patient and medical records.   Past Medical History:  Diagnosis Date   Breathing problem     Patient Active Problem List   Diagnosis Date Noted   COVID-19 09/27/2019   Asthma 05/17/2019   Miscarriage 05/17/2019    Past Surgical History:  Procedure Laterality Date   KNEE SURGERY      OB History     Gravida  3   Para  2   Term      Preterm      AB  1   Living         SAB  1   IAB      Ectopic      Multiple      Live Births               Home Medications    Prior to Admission medications   Medication Sig Start Date End Date Taking? Authorizing Provider  acetaminophen (TYLENOL) 500 MG tablet Take 1,000 mg by mouth every 6 (six) hours as needed for moderate pain.    [provider]  albuterol (VENTOLIN HFA) 108 (90 Base) MCG/ACT inhaler Inhale 2 puffs into the lungs every 6 (six) hours as needed for wheezing or shortness of breath. 09/27/19   Malfi, Jodelle Gross, FNP  azithromycin (ZITHROMAX) 250 MG tablet Take 2 tablets on day 1, then 1 tablet daily x 4 days 09/30/19   Tarri Fuller, FNP  cetirizine (ZYRTEC) 10 MG tablet TAKE 1 TABLET BY MOUTH EVERY DAY 10/20/19   Karamalegos, Netta Neat, DO  fluticasone (FLONASE) 50 MCG/ACT nasal spray Place 2 sprays into both nostrils daily. Use for 4-6 weeks then stop and use seasonally or as needed. 09/27/19   Malfi, Jodelle Gross,  FNP  montelukast (SINGULAIR) 10 MG tablet Take 1 tablet (10 mg total) by mouth at bedtime. 05/15/18   Karamalegos, Netta Neat, DO  promethazine-dextromethorphan (PROMETHAZINE-DM) 6.25-15 MG/5ML syrup Take 5 mLs by mouth 4 (four) times daily as needed for cough. 09/27/19   Malfi, Jodelle Gross, FNP    Family History Family History  Problem Relation Age of Onset   Asthma Sister    Asthma Daughter    Breast cancer Neg Hx     Social History Social History   Tobacco Use   Smoking status: Former    Types: Cigarettes   Smokeless tobacco: Former  Building services engineer Use: Never used  Substance Use Topics   Alcohol use: No   Drug use: No     Allergies   Shellfish allergy and Penicillins   Review of Systems Review of Systems  Constitutional:  Negative for chills and fever.  HENT:  Positive for congestion, ear pain, sinus pressure and sore throat. Negative for ear discharge.   Respiratory:  Positive for cough. Negative for shortness of breath.   Cardiovascular:  Negative for  chest pain and palpitations.  Gastrointestinal:  Negative for abdominal pain, diarrhea and vomiting.  Skin:  Negative for color change and rash.  Neurological:  Positive for headaches. Negative for dizziness, weakness and numbness.  All other systems reviewed and are negative.   Physical Exam Triage Vital Signs ED Triage Vitals  Enc Vitals Group     BP      Pulse      Resp      Temp      Temp src      SpO2      Weight      Height      Head Circumference      Peak Flow      Pain Score      Pain Loc      Pain Edu?      Excl. in GC?    No data found.  Updated Vital Signs BP 128/86   Pulse 74   Temp 98.6 F (37 C)   Resp 20   SpO2 97%   Visual Acuity Right Eye Distance:   Left Eye Distance:   Bilateral Distance:    Right Eye Near:   Left Eye Near:    Bilateral Near:     Physical Exam Vitals and nursing note reviewed.  Constitutional:      General: She is not in acute distress.     Appearance: She is well-developed.  HENT:     Head: Normocephalic and atraumatic.     Right Ear: Tympanic membrane normal. There is no impacted cerumen.     Left Ear: There is impacted cerumen.     Nose: Nose normal.     Mouth/Throat:     Mouth: Mucous membranes are moist.     Pharynx: Oropharynx is clear.  Eyes:     Conjunctiva/sclera: Conjunctivae normal.  Cardiovascular:     Rate and Rhythm: Normal rate and regular rhythm.     Heart sounds: Normal heart sounds.  Pulmonary:     Effort: Pulmonary effort is normal. No respiratory distress.     Breath sounds: Normal breath sounds.  Abdominal:     Palpations: Abdomen is soft.     Tenderness: There is no abdominal tenderness.  Musculoskeletal:     Cervical back: Neck supple.  Skin:    General: Skin is warm and dry.  Neurological:     General: No focal deficit present.     Mental Status: She is alert and oriented to person, place, and time.     Gait: Gait normal.  Psychiatric:        Mood and Affect: Mood normal.        Behavior: Behavior normal.     UC Treatments / Results  Labs (all labs ordered are listed, but only abnormal results are displayed) Labs Reviewed - No data to display   EKG   Radiology No results found.  Procedures Procedures (including critical care time)  Medications Ordered in UC Medications - No data to display  Initial Impression / Assessment and Plan / UC Course  I have reviewed the triage vital signs and the nursing notes.  Pertinent labs & imaging results that were available during my care of the patient were reviewed by me and considered in my medical decision making (see chart for details).   Left cerumen impaction, Viral URI.  Earwax removed by irrigation.  Patient declined COVID test here.  She states there is a sight that she can go to where the  tests are free.  Instructed her to quarantine per CDC guidelines.  Discussed symptomatic treatment including Tylenol or ibuprofen as needed,  rest, hydration.  Education provided on viral respiratory infections and earwax buildup.  Instructed patient to follow-up with her PCP if her symptoms are not improving.  She agrees to plan of care.   Final Clinical Impressions(s) / UC Diagnoses   Final diagnoses:  Impacted cerumen of left ear  Viral URI     Discharge Instructions      Take Tylenol or ibuprofen as needed for fever or discomfort.  Rest and keep yourself hydrated.    Follow-up with your primary care provider if your symptoms are not improving.         ED Prescriptions   None    PDMP not reviewed this encounter.   Mickie Bail, NP 09/13/20 [phone]

## 2020-09-13 NOTE — Discharge Instructions (Addendum)
Take Tylenol or ibuprofen as needed for fever or discomfort.  Rest and keep yourself hydrated.    Follow-up with your primary care provider if your symptoms are not improving.     

## 2021-06-16 ENCOUNTER — Encounter (HOSPITAL_COMMUNITY): Payer: Self-pay | Admitting: Emergency Medicine

## 2021-06-16 ENCOUNTER — Emergency Department (HOSPITAL_COMMUNITY)
Admission: EM | Admit: 2021-06-16 | Discharge: 2021-06-16 | Disposition: A | Discharge status: Home / Self Care | Payer: BC Managed Care – PPO | Attending: Emergency Medicine | Admitting: Emergency Medicine

## 2021-06-16 ENCOUNTER — Emergency Department (HOSPITAL_COMMUNITY): Payer: BC Managed Care – PPO

## 2021-06-16 ENCOUNTER — Other Ambulatory Visit: Payer: Self-pay

## 2021-06-16 DIAGNOSIS — R0602 Shortness of breath: Secondary | ICD-10-CM | POA: Insufficient documentation

## 2021-06-16 DIAGNOSIS — Z87891 Personal history of nicotine dependence: Secondary | ICD-10-CM | POA: Diagnosis not present

## 2021-06-16 DIAGNOSIS — M79605 Pain in left leg: Secondary | ICD-10-CM

## 2021-06-16 DIAGNOSIS — R079 Chest pain, unspecified: Secondary | ICD-10-CM | POA: Diagnosis not present

## 2021-06-16 DIAGNOSIS — M79662 Pain in left lower leg: Secondary | ICD-10-CM | POA: Diagnosis not present

## 2021-06-16 DIAGNOSIS — M7989 Other specified soft tissue disorders: Secondary | ICD-10-CM | POA: Diagnosis present

## 2021-06-16 LAB — D-DIMER, QUANTITATIVE: D-Dimer, Quant: 0.56 ug/mL-FEU — ABNORMAL HIGH (ref 0.00–0.50)

## 2021-06-16 LAB — CBC
HCT: 40.6 % (ref 36.0–46.0)
Hemoglobin: 13.8 g/dL (ref 12.0–15.0)
MCH: 32.7 pg (ref 26.0–34.0)
MCHC: 34 g/dL (ref 30.0–36.0)
MCV: 96.2 fL (ref 80.0–100.0)
Platelets: 324 10*3/uL (ref 150–400)
RBC: 4.22 MIL/uL (ref 3.87–5.11)
RDW: 11.9 % (ref 11.5–15.5)
WBC: 7.1 10*3/uL (ref 4.0–10.5)
nRBC: 0 % (ref 0.0–0.2)

## 2021-06-16 LAB — HCG, SERUM, QUALITATIVE: Preg, Serum: NEGATIVE

## 2021-06-16 LAB — BASIC METABOLIC PANEL
Anion gap: 5 (ref 5–15)
BUN: 14 mg/dL (ref 6–20)
CO2: 27 mmol/L (ref 22–32)
Calcium: 9.1 mg/dL (ref 8.9–10.3)
Chloride: 107 mmol/L (ref 98–111)
Creatinine, Ser: 0.79 mg/dL (ref 0.44–1.00)
GFR, Estimated: >60 mL/min (ref >60)
Glucose, Bld: 107 mg/dL — ABNORMAL HIGH (ref 70–99)
Potassium: 3.4 mmol/L — ABNORMAL LOW (ref 3.5–5.1)
Sodium: 139 mmol/L (ref 135–145)

## 2021-06-16 LAB — TROPONIN I (HIGH SENSITIVITY)
Troponin I (High Sensitivity): <2 ng/L (ref <18)
Troponin I (High Sensitivity): <2 ng/L (ref <18)

## 2021-06-16 LAB — BRAIN NATRIURETIC PEPTIDE: B Natriuretic Peptide: 17 pg/mL (ref 0.0–100.0)

## 2021-06-16 MED ORDER — IOHEXOL 350 MG/ML SOLN
100.0000 mL | Freq: Once | INTRAVENOUS | Status: AC | PRN
  Administered 2021-06-16: 100 mL via INTRAVENOUS

## 2021-06-16 NOTE — ED Provider Notes (Signed)
?[facility]-EMERGENCY DEPT ?[facility] Emergency Department ?Provider Note ?MRN:  [id number]  ?Arrival date & time: 06/16/21    ? ?Chief Complaint   ?Leg Swelling ?  ?History of Present Illness   ?Heidi Douglas is a 36 y.o. year-old female with no pertinent past medical history presenting to the ED with chief complaint of leg swelling. ? ?For the past week patient has been feeling left leg pain with swelling, also feeling some chest pain, shortness of breath intermittently.  PCP told her that she might have blood clots.  Denies fever or cough, no abdominal pain, no history of blood clots, has an IUD. ? ?Review of Systems  ?A thorough review of systems was obtained and all systems are negative except as noted in the HPI and PMH.  ? ?Patient's Health History   ? ?Past Medical History:  ?Diagnosis Date  ? Breathing problem   ?  ?Past Surgical History:  ?Procedure Laterality Date  ? KNEE SURGERY    ?  ?Family History  ?Problem Relation Age of Onset  ? Asthma Sister   ? Asthma Daughter   ? Breast cancer Neg Hx   ?  ?Social History  ? ?Socioeconomic History  ? Marital status: Single  ?  Spouse name: Not on file  ? Number of children: Not on file  ? Years of education: Not on file  ? Highest education level: Not on file  ?Occupational History  ? Not on file  ?Tobacco Use  ? Smoking status: Former  ?  Types: Cigarettes  ? Smokeless tobacco: Former  ?Vaping Use  ? Vaping Use: Never used  ?Substance and Sexual Activity  ? Alcohol use: No  ? Drug use: No  ? Sexual activity: Yes  ?  Partners: Male  ?  Birth control/protection: None  ?Other Topics Concern  ? Not on file  ?Social History Narrative  ? Not on file  ? ?Social Determinants of Health  ? ?Financial Resource Strain: Not on file  ?Food Insecurity: Not on file  ?Transportation Needs: Not on file  ?Physical Activity: Not on file  ?Stress: Not on file  ?Social Connections: Not on file  ?Intimate Partner Violence: Not on file  ?  ? ?Physical Exam  ? ?Vitals:  ? 06/16/21  0515 06/16/21 0530  ?BP: 126/75 (!) 141/89  ?Pulse: 83 91  ?Resp: (!) 7 18  ?Temp:    ?SpO2: 99% 99%  ?  ?CONSTITUTIONAL: Well-appearing, NAD ?NEURO/PSYCH:  Alert and oriented x 3, no focal deficits ?EYES:  eyes equal and reactive ?ENT/NECK:  no LAD, no JVD ?CARDIO: Regular rate, well-perfused, normal S1 and S2 ?PULM:  CTAB no wheezing or rhonchi ?GI/GU:  non-distended, non-tender ?MSK/SPINE:  No gross deformities, no edema ?SKIN:  no rash, atraumatic ? ? ?*Additional and/or pertinent findings included in MDM below ? ?Diagnostic and Interventional Summary  ? ? EKG Interpretation ? ?Date/Time:  Wednesday Jun 16 2021 03:14:39 EDT ?Ventricular Rate:  81 ?PR Interval:  193 ?QRS Duration: 83 ?QT Interval:  383 ?QTC Calculation: 445 ?R Axis:   80 ?Text Interpretation: Sinus rhythm Borderline repolarization abnormality Confirmed by Kennis Carina [phone]) on 06/16/2021 3:20:04 AM ?  ? ?  ? ?Labs Reviewed  ?BASIC METABOLIC PANEL - Abnormal; Notable for the following components:  ?    Result Value  ? Potassium 3.4 (*)   ? Glucose, Bld 107 (*)   ? All other components within normal limits  ?D-DIMER, QUANTITATIVE - Abnormal; Notable for the following components:  ?  D-Dimer, Quant 0.56 (*)   ? All other components within normal limits  ?CBC  ?BRAIN NATRIURETIC PEPTIDE  ?HCG, SERUM, QUALITATIVE  ?TROPONIN I (HIGH SENSITIVITY)  ?TROPONIN I (HIGH SENSITIVITY)  ?  ?CT Angio Chest Pulmonary Embolism (PE) W or WO Contrast  ?Final Result  ?  ?DG Chest Port 1 View  ?Final Result  ?  ?  ?Medications  ?iohexol (OMNIPAQUE) 350 MG/ML injection 100 mL (100 mLs Intravenous Contrast Given 06/16/21 0449)  ?  ? ?Procedures  /  Critical Care ?Ultrasound ED DVT ? ?Date/Time: 06/16/2021 5:53 AM ?Performed by: Sabas Sous, MD ?Authorized by: Sabas Sous, MD  ? ?Procedure details:  ?  Indications: lower extremity pain   ?  Assessment for:  DVT ?  Images Archived: Yes   ?LLE Findings:  ?  Left common femoral vein:  Compressible ?  Left popliteal  vein:  Compressible ?IMPRESSION: ?  DVT:  ?   None ? ?ED Course and Medical Decision Making  ?Initial Impression and Ddx ?Chest pain, shortness of breath, unilateral leg pain.  Legs appear normal and symmetric on my exam, overall low concern for DVT.  However in the setting of chest pain and unilateral leg symptoms, will initiate work-up with D-dimer. ? ?Past medical/surgical history that increases complexity of ED encounter: None ? ?Interpretation of Diagnostics ?I personally reviewed the EKG and my interpretation is as follows: Sinus rhythm ?   ?Labs are reassuring with no significant blood count or electrolyte disturbance, troponin negative, BNP normal, D-dimer mildly elevated. ? ?CTA is without PE. ? ?Patient Reassessment and Ultimate Disposition/Management ?Patient looking and feeling well on reassessment, no increased work of breathing.  Reassuring work-up, bedside ultrasound revealing no evidence of DVT to the affected leg, which clinically does not appear swollen, is not tender, does not appear pathologic in any way.  Appropriate for discharge. ? ?Patient management required discussion with the following services or consulting groups:  None ? ?Complexity of Problems Addressed ?Acute illness or injury that poses threat of life of bodily function ? ?Additional Data Reviewed and Analyzed ?Further history obtained from: ?Further history from spouse/family member ? ?Additional Factors Impacting ED Encounter Risk ?Minor Procedures ? ?Elmer Sow. Pilar Plate, MD ?[facility] ?[facility] [email or web address] ? ?Final Clinical Impressions(s) / ED Diagnoses  ? ?  ICD-10-CM   ?1. Pain of left lower extremity  M79.605   ?  ?2. Chest pain, unspecified type  R07.9   ?  ?  ?ED Discharge Orders   ? ? None  ? ?  ?  ? ?Discharge Instructions Discussed with and Provided to Patient:  ? ? ? ?Discharge Instructions   ? ?  ?You were evaluated in the Emergency Department and after careful evaluation, we  did not find any emergent condition requiring admission or further testing in the hospital. ? ?Your exam/testing today was overall reassuring.  No signs of blood clots or heart damage.  Recommend Tylenol or Motrin at home for discomfort. ? ?Please return to the Emergency Department if you experience any worsening of your condition.  Thank you for allowing [place] to be a part of your care. ? ? ? ? ? ?  ?Sabas Sous, MD ?06/16/21 [phone] ? ?

## 2021-06-16 NOTE — ED Notes (Signed)
Patient ambulated to restroom without any assistance

## 2021-06-16 NOTE — ED Triage Notes (Signed)
Pt c/o left leg swelling intermittently for a while with sob.  ?

## 2021-06-16 NOTE — Discharge Instructions (Signed)
You were evaluated in the Emergency Department and after careful evaluation, we did not find any emergent condition requiring admission or further testing in the hospital. ? ?Your exam/testing today was overall reassuring.  No signs of blood clots or heart damage.  Recommend Tylenol or Motrin at home for discomfort. ? ?Please return to the Emergency Department if you experience any worsening of your condition.  Thank you for allowing [place] to be a part of your care. ? ?

## 2022-01-09 ENCOUNTER — Other Ambulatory Visit: Payer: Self-pay

## 2022-01-09 ENCOUNTER — Encounter (HOSPITAL_COMMUNITY): Payer: Self-pay | Admitting: *Deleted

## 2022-01-09 ENCOUNTER — Emergency Department (HOSPITAL_COMMUNITY)
Admission: EM | Admit: 2022-01-09 | Discharge: 2022-01-09 | Disposition: A | Discharge status: Home / Self Care | Payer: BC Managed Care – PPO | Attending: Emergency Medicine | Admitting: Emergency Medicine

## 2022-01-09 ENCOUNTER — Emergency Department (HOSPITAL_COMMUNITY): Payer: BC Managed Care – PPO

## 2022-01-09 DIAGNOSIS — Y9389 Activity, other specified: Secondary | ICD-10-CM | POA: Diagnosis not present

## 2022-01-09 DIAGNOSIS — S63632A Sprain of interphalangeal joint of right middle finger, initial encounter: Secondary | ICD-10-CM | POA: Diagnosis not present

## 2022-01-09 DIAGNOSIS — S6991XA Unspecified injury of right wrist, hand and finger(s), initial encounter: Secondary | ICD-10-CM | POA: Diagnosis present

## 2022-01-09 DIAGNOSIS — X58XXXA Exposure to other specified factors, initial encounter: Secondary | ICD-10-CM | POA: Diagnosis not present

## 2022-01-09 MED ORDER — ACETAMINOPHEN 325 MG PO TABS
650.0000 mg | ORAL_TABLET | Freq: Once | ORAL | Status: AC
  Administered 2022-01-09: 650 mg via ORAL
  Filled 2022-01-09: qty 2

## 2022-01-09 NOTE — Discharge Instructions (Signed)
You were seen in the emergency department for your finger pain.  Your x-ray showed no broken bones.  You likely sprained your finger and you can buddy tape it to help your finger heal.  You can ice your hand and take Tylenol or Motrin for pain.  You can follow-up with your primary doctor to have your symptoms rechecked.  You can return to the emergency department for any new or worsening symptoms.

## 2022-01-09 NOTE — ED Triage Notes (Signed)
Pt with right middle finger pain after injury while playing around with boyfriend today.

## 2022-01-09 NOTE — ED Provider Notes (Signed)
[facility] EMERGENCY DEPARTMENT Provider Note   CSN: [id number] Arrival date & time: 01/09/22  2017     History  Chief Complaint  Patient presents with   Finger Injury    Heidi Douglas is a 36 y.o. female.  Patient is a 36 year old right-hand-dominant female presenting to the emergency department with right middle finger pain.  She states that she was "messing around" with her boyfriend prior to coming to the emergency department when she suddenly felt severe pain in her right middle finger.  She states that she did not fall or land on it and is unsure how she injured it.  She states that she is now having difficulty bending her finger due to the pain.  She denies any numbness or weakness.  She states she has not taken anything for pain yet.  The history is provided by the patient.       Home Medications Prior to Admission medications   Medication Sig Start Date End Date Taking? Authorizing Provider  acetaminophen (TYLENOL) 500 MG tablet Take 1,000 mg by mouth every 6 (six) hours as needed for moderate pain.    [provider]  albuterol (VENTOLIN HFA) 108 (90 Base) MCG/ACT inhaler Inhale 2 puffs into the lungs every 6 (six) hours as needed for wheezing or shortness of breath. 09/27/19   Malfi, Jodelle Gross, FNP  azithromycin (ZITHROMAX) 250 MG tablet Take 2 tablets on day 1, then 1 tablet daily x 4 days 09/30/19   Tarri Fuller, FNP  cetirizine (ZYRTEC) 10 MG tablet TAKE 1 TABLET BY MOUTH EVERY DAY 10/20/19   Karamalegos, Netta Neat, DO  fluticasone (FLONASE) 50 MCG/ACT nasal spray Place 2 sprays into both nostrils daily. Use for 4-6 weeks then stop and use seasonally or as needed. 09/27/19   Malfi, Jodelle Gross, FNP  montelukast (SINGULAIR) 10 MG tablet Take 1 tablet (10 mg total) by mouth at bedtime. 05/15/18   Karamalegos, Netta Neat, DO  promethazine-dextromethorphan (PROMETHAZINE-DM) 6.25-15 MG/5ML syrup Take 5 mLs by mouth 4 (four) times daily as needed for cough.  09/27/19   Malfi, Jodelle Gross, FNP      Allergies    Shellfish allergy and Penicillins    Review of Systems   Review of Systems  Physical Exam Updated Vital Signs BP (!) 150/96 (BP Location: Left Arm)   Pulse 80   Temp 98.8 F (37.1 C) (Oral)   Resp 16   Ht 5\' 3"  (1.6 m)   Wt 86.2 kg   SpO2 100%   BMI 33.66 kg/m  Physical Exam Vitals and nursing note reviewed.  Constitutional:      General: She is not in acute distress.    Appearance: Normal appearance.  HENT:     Head: Normocephalic and atraumatic.     Nose: Nose normal.     Mouth/Throat:     Mouth: Mucous membranes are moist.  Eyes:     Extraocular Movements: Extraocular movements intact.     Conjunctiva/sclera: Conjunctivae normal.  Cardiovascular:     Rate and Rhythm: Normal rate.     Pulses: Normal pulses.  Pulmonary:     Effort: Pulmonary effort is normal.  Abdominal:     General: Abdomen is flat.  Musculoskeletal:        General: Normal range of motion.     Cervical back: Normal range of motion and neck supple.     Comments: Tenderness to palpation of right middle finger PIP joint with no significant overlying swelling and  no obvious deformity Full flexion/extension of PIP, DIP and MCP joint Strength and sensation intact  Skin:    General: Skin is warm and dry.  Neurological:     General: No focal deficit present.     Mental Status: She is alert and oriented to person, place, and time.  Psychiatric:        Mood and Affect: Mood normal.        Behavior: Behavior normal.     ED Results / Procedures / Treatments   Labs (all labs ordered are listed, but only abnormal results are displayed) Labs Reviewed - No data to display  EKG None  Radiology DG Hand Complete Right  Result Date: 01/09/2022 CLINICAL DATA:  Third digit pain following injury, initial encounter EXAM: RIGHT HAND - COMPLETE 3+ VIEW COMPARISON:  None Available. FINDINGS: There is no evidence of fracture or dislocation. There is no  evidence of arthropathy or other focal bone abnormality. Soft tissues are unremarkable. IMPRESSION: No acute abnormality noted. Electronically Signed   By: Alcide Clever M.D.   On: 01/09/2022 21:54    Procedures Procedures    Medications Ordered in ED Medications  acetaminophen (TYLENOL) tablet 650 mg (650 mg Oral Given 01/09/22 2155)    ED Course/ Medical Decision Making/ A&P                           Medical Decision Making This patient presents to the ED with chief complaint(s) of finger pain with no pertinent past medical history which further complicates the presenting complaint. The complaint involves an extensive differential diagnosis and also carries with it a high risk of complications and morbidity.    The differential diagnosis includes finger fracture, finger sprain, no other injury seen on exam   Additional history obtained: Additional history obtained from N/A Records reviewed N/A  ED Course and Reassessment: Patient does have tenderness to palpation to her right third PIP joint will have x-ray performed to evaluate for fracture or dislocation.  She was given Tylenol and ice pack for pain.  X-ray showed no acute bony abnormality.  She will have her fingers buddy taped and was recommended primary care follow-up  Independent labs interpretation:  N/A  Independent visualization of imaging: - I independently visualized the following imaging with scope of interpretation limited to determining acute life threatening conditions related to emergency care: R hand XR, which revealed No bony abnormality  Consultation: - Consulted or discussed management/test interpretation w/ external professional: N/A  Consideration for admission or further workup: Patient has no emergent conditions requiring admission or further work-up at this time and is stable for discharge home with primary care follow-up  Social Determinants of health: N/A    Amount and/or Complexity of Data  Reviewed Radiology: ordered.  Risk OTC drugs.          Final Clinical Impression(s) / ED Diagnoses Final diagnoses:  Sprain of interphalangeal joint of right middle finger, initial encounter    Rx / DC Orders ED Discharge Orders     None         Rexford Maus, DO 01/09/22 2214

## 2022-08-23 IMAGING — CT CT ANGIO CHEST
2 of 8 series · 18 of 46 positions shown · IV contrast (Omnipaque or Isovue)
Comparison: No priors.

CLINICAL DATA: 36-year-old female with history of left leg swelling
intermittently. Shortness of breath. Evaluate for pulmonary
embolism.

EXAM:
CT ANGIOGRAPHY CHEST WITH CONTRAST
TECHNIQUE: Multidetector CT imaging of the chest was performed using the
standard protocol during bolus administration of intravenous
contrast. Multiplanar CT image reconstructions and MIPs were
obtained to evaluate the vascular anatomy.

[Series 5: pe axial thins · axial · 0.62mm/px · z∈[+1440,+1672]mm · 15 of 328 slices shown]
[im 19/328  lung]
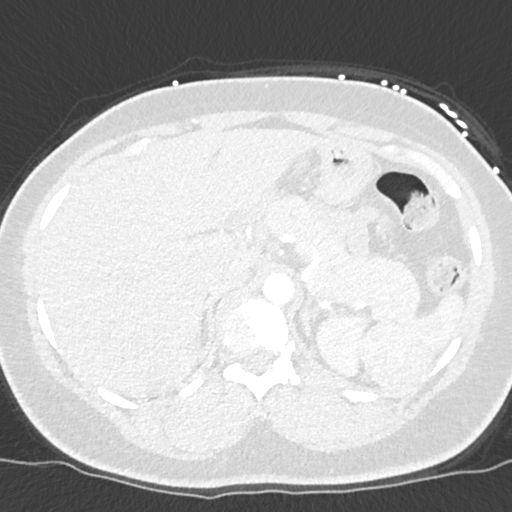
[im 37/328  soft-tissue]
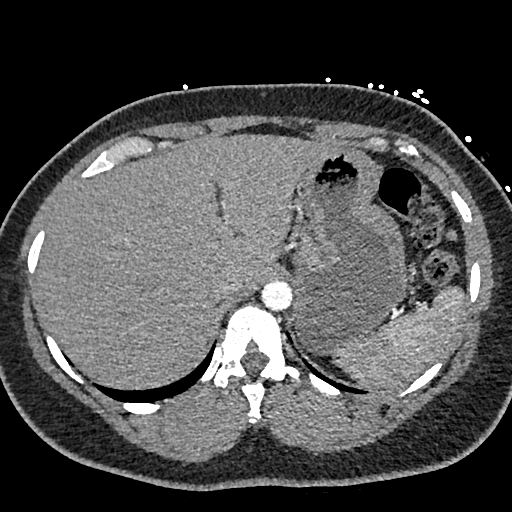
[im 55/328  lung]
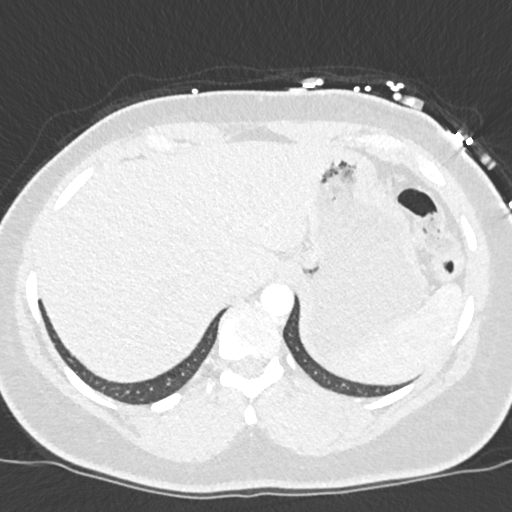
[im 73/328  soft-tissue]
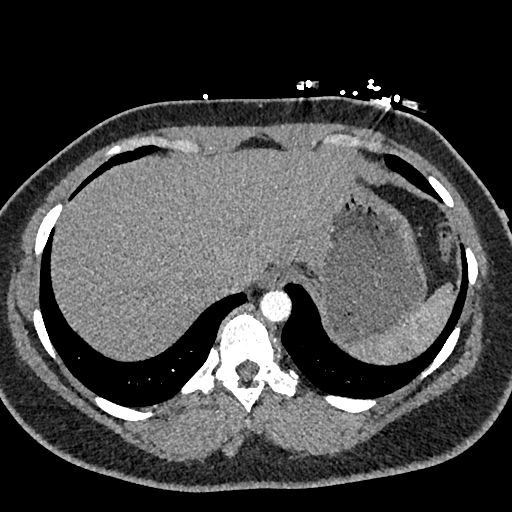
[im 110/328  lung]
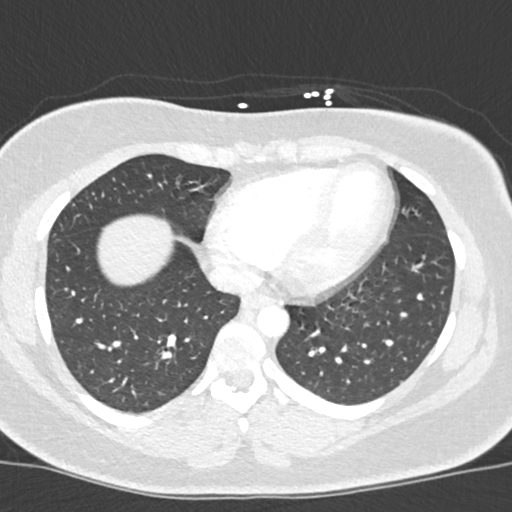
[im 128/328  soft-tissue]
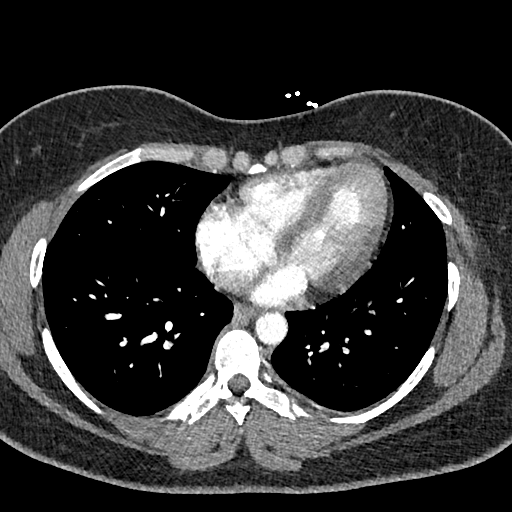
[im 146/328  lung]
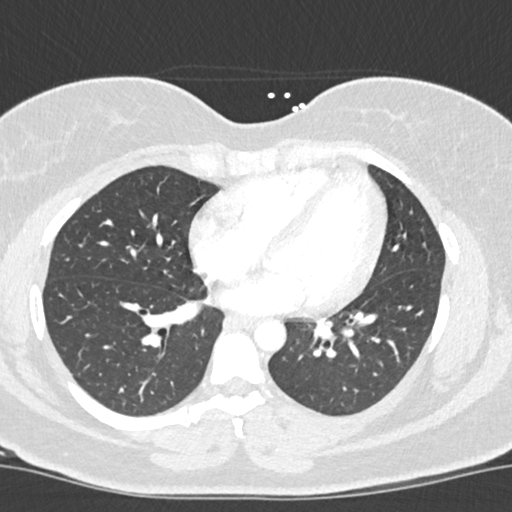
[im 164/328  soft-tissue]
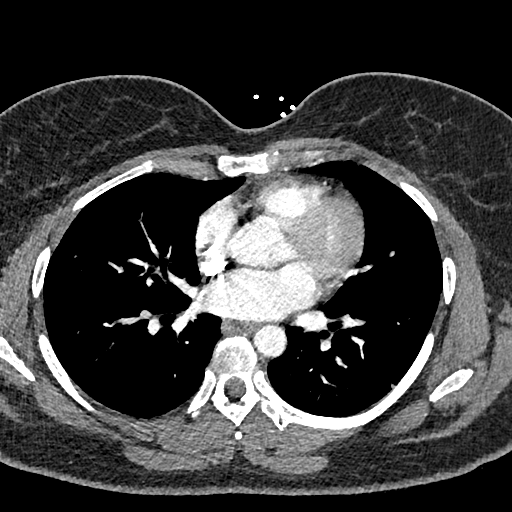
[im 182/328  lung]
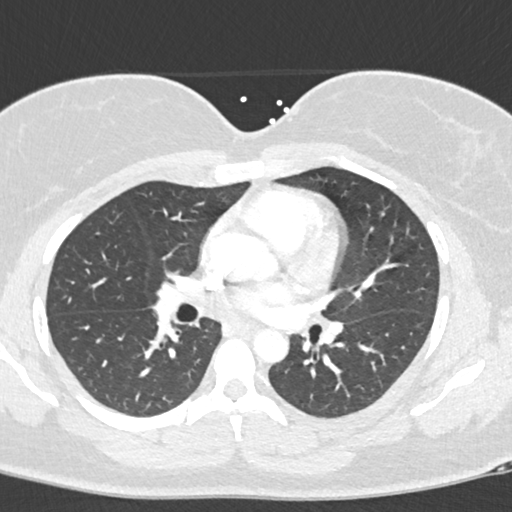
[im 200/328  soft-tissue]
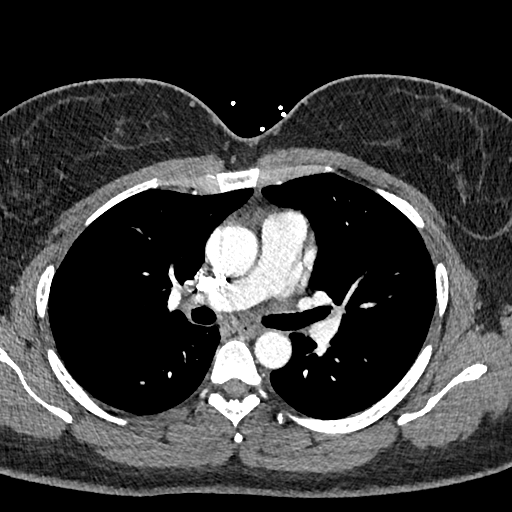
[im 219/328  lung]
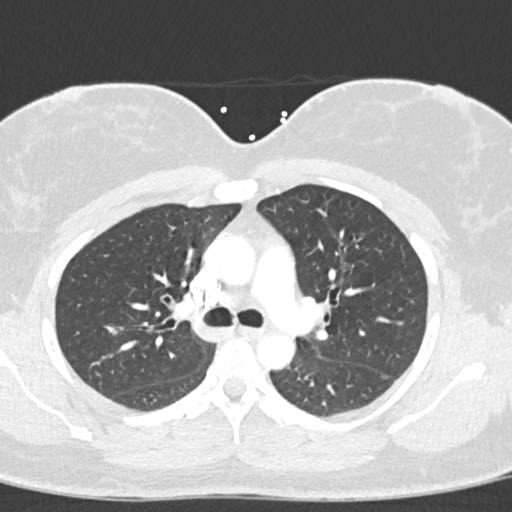
[im 255/328  soft-tissue]
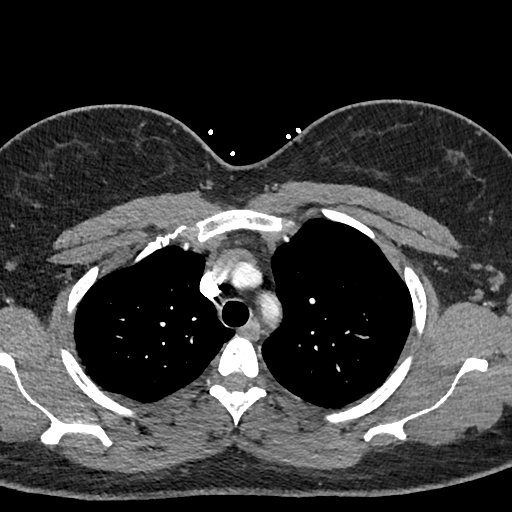
[im 273/328  lung]
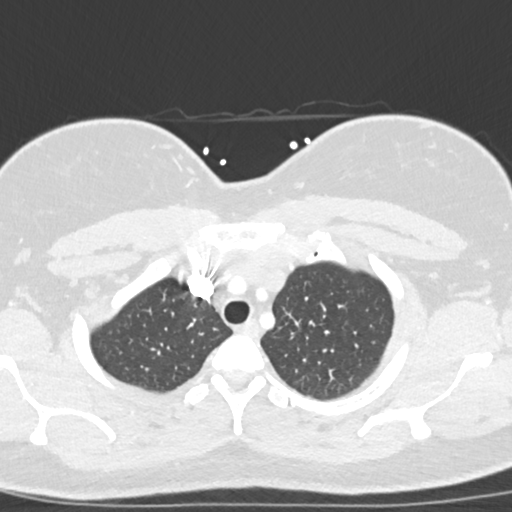
[im 291/328  soft-tissue]
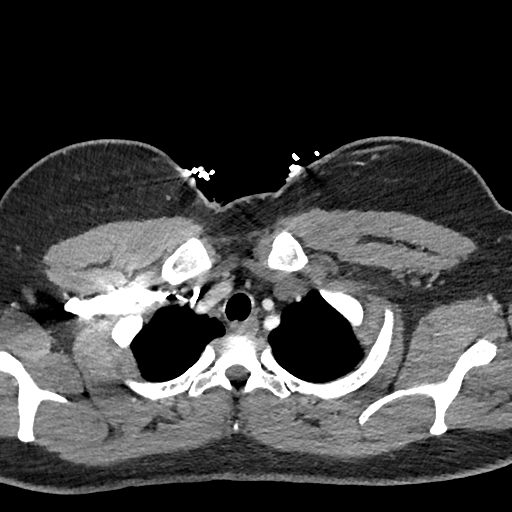
[im 309/328  lung]
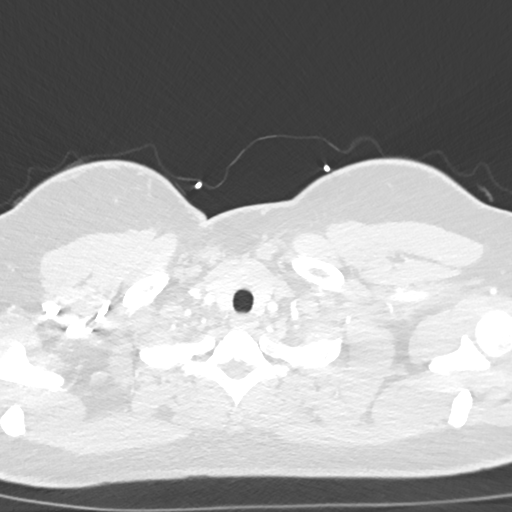

[Series 8: cor soft · coronal · 0.54mm/px · 3 of 123 slices shown]
[im 31/123  soft-tissue]
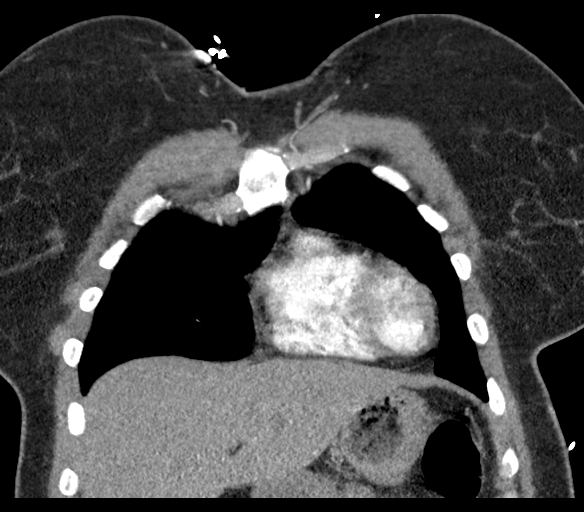
[im 62/123  soft-tissue]
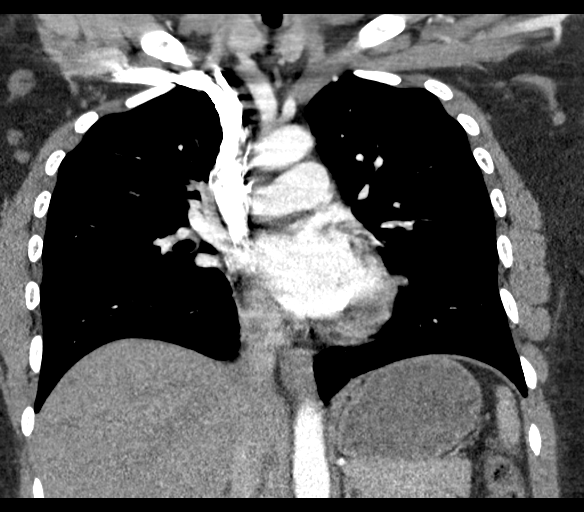
[im 92/123  soft-tissue]
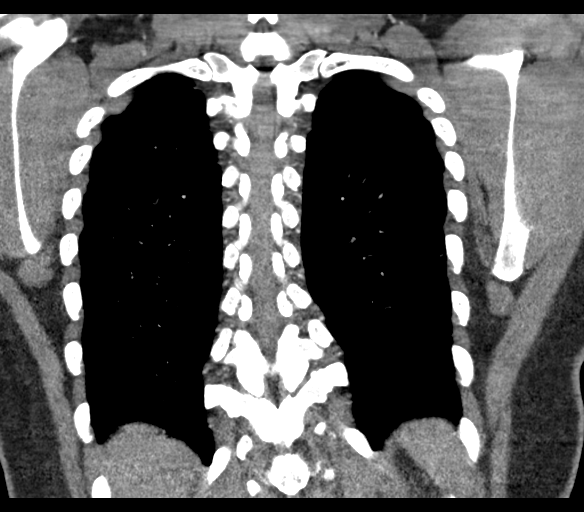

[18 of 46 positions shown; findings below may reference images not displayed]

RADIATION DOSE REDUCTION: This exam was performed according to the
departmental dose-optimization program which includes automated
exposure control, adjustment of the mA and/or kV according to
patient size and/or use of iterative reconstruction technique.

CONTRAST:  100mL OMNIPAQUE IOHEXOL 350 MG/ML SOLN
FINDINGS: Cardiovascular: There are no filling defects within the pulmonary
arterial tree to suggest pulmonary embolism. Heart size is normal.
There is no significant pericardial fluid, thickening or pericardial
calcification. No atherosclerotic calcifications are noted in the
thoracic aorta or the coronary arteries.

Mediastinum/Nodes: No pathologically enlarged mediastinal or hilar
lymph nodes. Esophagus is unremarkable in appearance. No axillary
lymphadenopathy.

Lungs/Pleura: Multiple tiny 2-3 mm pulmonary nodules scattered
throughout the periphery of both lungs, nonspecific, but strongly
favored to represent benign areas of mucoid impaction within
terminal bronchioles. No larger more suspicious appearing pulmonary
nodules or masses are noted. No acute consolidative airspace
disease. No pleural effusions.

Upper Abdomen: Unremarkable.

Musculoskeletal: There are no aggressive appearing lytic or blastic
lesions noted in the visualized portions of the skeleton.

Review of the MIP images confirms the above findings.
IMPRESSION: 1. No acute findings in the thorax to account for the patient's
symptoms. Specifically, no evidence of pulmonary embolism.
2. Multiple small 2-3 mm pulmonary nodules in the periphery of the
lungs, nonspecific, but statistically likely benign in this young
patient. No follow-up needed if patient is low-risk (and has no
known or suspected primary neoplasm). Non-contrast chest CT can be
considered in 12 months if patient is high-risk. This recommendation
follows the consensus statement: Guidelines for Management of
Incidental Pulmonary Nodules Detected on CT Images: From the
